# Patient Record
Sex: Male | Born: 1945 | Race: White | Hispanic: No | Marital: Married | Smoking: Never smoker
Health system: Southern US, Community
[De-identification: ages and names within clinical notes are randomized; demographics above are authoritative.]

## PROBLEM LIST (undated history)

## (undated) DIAGNOSIS — I10 Essential (primary) hypertension: Secondary | ICD-10-CM

## (undated) DIAGNOSIS — E782 Mixed hyperlipidemia: Secondary | ICD-10-CM

## (undated) DIAGNOSIS — H269 Unspecified cataract: Secondary | ICD-10-CM

## (undated) HISTORY — DX: Essential (primary) hypertension: I10

## (undated) HISTORY — PX: JOINT REPLACEMENT: SHX530

## (undated) HISTORY — DX: Unspecified cataract: H26.9

## (undated) HISTORY — DX: Mixed hyperlipidemia: E78.2

## (undated) HISTORY — PX: OTHER SURGICAL HISTORY: SHX169

## (undated) HISTORY — PX: EYE SURGERY: SHX253

---

## 2005-11-08 ENCOUNTER — Encounter: Payer: Self-pay | Admitting: Orthopedic Surgery

## 2005-11-09 ENCOUNTER — Inpatient Hospital Stay (HOSPITAL_COMMUNITY): Admission: EM | Admit: 2005-11-09 | Discharge: 2005-11-13 | Payer: Self-pay | Admitting: Emergency Medicine

## 2005-11-09 ENCOUNTER — Encounter (INDEPENDENT_AMBULATORY_CARE_PROVIDER_SITE_OTHER): Payer: Self-pay | Admitting: *Deleted

## 2005-11-09 ENCOUNTER — Ambulatory Visit: Payer: Self-pay | Admitting: Internal Medicine

## 2005-11-09 ENCOUNTER — Ambulatory Visit: Payer: Self-pay | Admitting: Cardiology

## 2006-12-26 ENCOUNTER — Ambulatory Visit: Payer: Self-pay | Admitting: Cardiology

## 2007-01-08 ENCOUNTER — Ambulatory Visit: Payer: Self-pay | Admitting: Cardiology

## 2007-01-08 ENCOUNTER — Encounter: Payer: Self-pay | Admitting: Cardiology

## 2007-01-08 ENCOUNTER — Ambulatory Visit: Payer: Self-pay

## 2007-01-08 LAB — CONVERTED CEMR LAB
Alkaline Phosphatase: 77 units/L (ref 39–117)
Basophils Absolute: 0.1 10*3/uL (ref 0.0–0.1)
Basophils Relative: 0.7 % (ref 0.0–1.0)
Direct LDL: 162 mg/dL
GFR calc non Af Amer: 73 mL/min
HDL: 40.2 mg/dL (ref >39.0)
Ketones, ur: NEGATIVE mg/dL
Leukocytes, UA: NEGATIVE
MCHC: 34.9 g/dL (ref 30.0–36.0)
MCV: 92.6 fL (ref 78.0–100.0)
Monocytes Absolute: 0.5 10*3/uL (ref 0.2–0.7)
Monocytes Relative: 7.1 % (ref 3.0–11.0)
Neutro Abs: 4.8 10*3/uL (ref 1.4–7.7)
Neutrophils Relative %: 65.7 % (ref 43.0–77.0)
Nitrite: NEGATIVE
RBC: 5.28 M/uL (ref 4.22–5.81)
Sodium: 143 meq/L (ref 135–145)
Total Bilirubin: 1 mg/dL (ref 0.3–1.2)
Total CHOL/HDL Ratio: 5.4
Total Protein: 6.8 g/dL (ref 6.0–8.3)
Urobilinogen, UA: 0.2 (ref 0.0–1.0)
VLDL: 22 mg/dL (ref 0–40)
WBC: 7.4 10*3/uL (ref 4.5–10.5)

## 2007-01-10 ENCOUNTER — Ambulatory Visit: Payer: Self-pay | Admitting: Cardiology

## 2007-02-17 ENCOUNTER — Ambulatory Visit: Payer: Self-pay | Admitting: Cardiology

## 2007-02-17 LAB — CONVERTED CEMR LAB
AST: 50 units/L — ABNORMAL HIGH (ref 0–37)
Alkaline Phosphatase: 67 units/L (ref 39–117)
Bilirubin, Direct: 0.2 mg/dL (ref 0.0–0.3)
Cholesterol: 138 mg/dL (ref 0–200)
Total Protein: 6.9 g/dL (ref 6.0–8.3)

## 2007-03-07 ENCOUNTER — Ambulatory Visit: Payer: Self-pay | Admitting: Cardiology

## 2007-09-02 ENCOUNTER — Ambulatory Visit: Payer: Self-pay | Admitting: Cardiology

## 2007-09-15 ENCOUNTER — Ambulatory Visit: Payer: Self-pay

## 2007-09-15 ENCOUNTER — Encounter: Payer: Self-pay | Admitting: Cardiology

## 2008-03-01 ENCOUNTER — Ambulatory Visit: Payer: Self-pay | Admitting: Cardiology

## 2008-03-01 LAB — CONVERTED CEMR LAB
AST: 39 units/L — ABNORMAL HIGH (ref 0–37)
Albumin: 4.1 g/dL (ref 3.5–5.2)
BUN: 27 mg/dL — ABNORMAL HIGH (ref 6–23)
CO2: 31 meq/L (ref 19–32)
Calcium: 9.3 mg/dL (ref 8.4–10.5)
Cholesterol: 141 mg/dL (ref 0–200)
Creatinine, Ser: 1.2 mg/dL (ref 0.4–1.5)
Glucose, Bld: 61 mg/dL — ABNORMAL LOW (ref 70–99)
HDL: 32 mg/dL — ABNORMAL LOW (ref >39.0)
Sodium: 142 meq/L (ref 135–145)
Total Bilirubin: 1.1 mg/dL (ref 0.3–1.2)
Total CHOL/HDL Ratio: 4.4
Total Protein: 6.8 g/dL (ref 6.0–8.3)
VLDL: 23 mg/dL (ref 0–40)

## 2009-04-01 ENCOUNTER — Ambulatory Visit: Payer: Self-pay | Admitting: Cardiology

## 2009-04-05 LAB — CONVERTED CEMR LAB
ALT: 38 units/L (ref 0–53)
AST: 30 units/L (ref 0–37)
Albumin: 4.4 g/dL (ref 3.5–5.2)
Bilirubin, Direct: 0.2 mg/dL (ref 0.0–0.3)
Calcium: 10 mg/dL (ref 8.4–10.5)
Glucose, Bld: 87 mg/dL (ref 70–99)
HDL: 40.7 mg/dL (ref >39.00)
Potassium: 4.1 meq/L (ref 3.5–5.1)
Sodium: 143 meq/L (ref 135–145)
Total Bilirubin: 1.1 mg/dL (ref 0.3–1.2)
Total CHOL/HDL Ratio: 4
Triglycerides: 87 mg/dL (ref 0.0–149.0)

## 2009-04-06 ENCOUNTER — Encounter: Payer: Self-pay | Admitting: Cardiology

## 2010-04-26 ENCOUNTER — Ambulatory Visit: Payer: Self-pay | Admitting: Cardiology

## 2010-05-03 ENCOUNTER — Ambulatory Visit: Payer: Self-pay | Admitting: Cardiology

## 2010-05-03 ENCOUNTER — Ambulatory Visit: Payer: Self-pay | Admitting: Cardiovascular Disease

## 2010-05-03 ENCOUNTER — Ambulatory Visit (HOSPITAL_COMMUNITY): Admission: RE | Admit: 2010-05-03 | Discharge: 2010-05-03 | Payer: Self-pay | Admitting: Cardiology

## 2010-05-03 ENCOUNTER — Encounter: Payer: Self-pay | Admitting: Cardiology

## 2010-05-03 ENCOUNTER — Ambulatory Visit: Payer: Self-pay

## 2010-05-09 ENCOUNTER — Telehealth: Payer: Self-pay | Admitting: Cardiology

## 2010-05-09 LAB — CONVERTED CEMR LAB
AST: 26 units/L (ref 0–37)
Calcium: 9.8 mg/dL (ref 8.4–10.5)
Cholesterol: 237 mg/dL — ABNORMAL HIGH (ref 0–200)
Creatinine, Ser: 1.1 mg/dL (ref 0.4–1.5)
Direct LDL: 174.7 mg/dL
GFR calc non Af Amer: 73.97 mL/min (ref >60)
Sodium: 141 meq/L (ref 135–145)
TSH: 1.4 microintl units/mL (ref 0.35–5.50)
Total Bilirubin: 0.9 mg/dL (ref 0.3–1.2)
Total CHOL/HDL Ratio: 6
Triglycerides: 157 mg/dL — ABNORMAL HIGH (ref 0.0–149.0)
VLDL: 31.4 mg/dL (ref 0.0–40.0)

## 2010-10-31 NOTE — Assessment & Plan Note (Signed)
Summary: 401.1  272.2  1 year rov pfh,rn   Visit Type:  1 yr f/u Primary Provider:  No PCP at this time  CC:  no cardiac complaints today.  History of Present Illness: Matthew Hull returns for evaluation and management his hypertension, mixed hyperlipidemia, and obesity.  His blood pressure is been running around 150 systolic when he checks it. This is usually after he's been doing something. He rarely takes at rest.  He denies any chest tightness or angina. He had orthopnea PND or edema.  He is no longer on simvastatin. He takes fish oil. Lipids from last are reviewed and looked remarkably good. He needs repeat values.  His weight is about the same. Activity levels about the same. He is compliant with his medications.  Current Medications (verified): 1)  Atenolol-Chlorthalidone 100-25 Mg Tabs (Atenolol-Chlorthalidone) .... Take 1/2 Tablet By Mouth Once A Day 2)  Fish Oil 500 Mg Caps (Omega-3 Fatty Acids) .[name].. 1 Cap Once Daily 3)  Aspirin 81 Mg Tbec (Aspirin) .... Take One Tablet By Mouth Daily 4)  Potassium Chloride Crys Cr 20 Meq Cr-Tabs (Potassium Chloride Crys Cr) .[name].. 1 Tab Once Daily  Allergies (verified): No Known Drug Allergies  Past History:  Past Medical History: Last updated: 28-Mar-2009 HYPERTENSION, UNSPECIFIED (ICD-401.9) HYPERLIPIDEMIA-MIXED (ICD-272.4)    Past Surgical History: Last updated: 03-28-2009  1.  I&D of skin and subcutaneous tissue, bone, and nonviable, necrotic.[name] 11/09/2005      tissue, right middle finger.  2.  Curettage osteomyelitic focus, right middle finger.  SURGEON:  Dionne Ano. Amanda Pea, MD.  Family History: Last updated: 03-28-09 Father: deceased at age 10..cancer throat/neck Mother: alive had pacemaker 3/08.Marland Kitchenas per new pt form 12/25/06 Siblings: 1 sister and 2 brothers.Marland Kitchenas per new pt form 12/25/06  Social History: Last updated: 03/28/09 Full Time Married  Tobacco Use - No.  Alcohol Use - yes Regular Exercise - no Drug Use -  no  Risk Factors: Exercise: no (2009-03-28)  Risk Factors: Smoking Status: never (28-Mar-2009)  Review of Systems       nother than history of present illness  Vital Signs:  Patient profile:   65 year old male Height:      71 inches Weight:      218 pounds BMI:     30.51 Pulse rate:   66 / minute Pulse rhythm:   regular BP sitting:   138 / 88  (left arm) Cuff size:   large  Vitals Entered By: Danielle Rankin, CMA (April 26, 2010 3:15 PM)  Physical Exam  General:  obese.   Head:  normocephalic and atraumatic Eyes:  PERRLA/EOM intact; conjunctiva and lids normal. Neck:  Neck supple, no JVD. No masses, thyromegaly or abnormal cervical nodes. Chest Matthew Hull:  no deformities or breast masses noted Lungs:  Clear bilaterally to auscultation and percussion. Heart:  Non-displaced PMI, chest non-tender; regular rate and rhythm, S1, S2 without murmurs, rubs or gallops. Carotid upstroke normal, no bruit. Normal abdominal aortic size, no bruits. Femorals normal pulses, no bruits. Pedals normal pulses. No edema, no varicosities. Abdomen:  Bowel sounds positive; abdomen soft and non-tender without masses, organomegaly, or hernias noted. No hepatosplenomegaly. Msk:  decreased ROM.   Pulses:  pulses normal in all 4 extremities Extremities:  No clubbing or cyanosis. Neurologic:  Alert and oriented x 3. Skin:  Intact without lesions or rashes. Psych:  Normal affect.   EKG  Procedure date:  04/26/2010  Findings:      normal sinus rhythm, normal EKG  Impression & Recommendations:  Problem # 1:  HYPERTENSION, UNSPECIFIED (ICD-401.9) I am concerned that his pressure has been elevated. Advised to check it in the morning hours he takes his atenolol and diuretic. His left ventricular hypertrophy reversed in the past. We'll recheck an echocardiogram to confirm that this has remained stable and that his pressure has indeed been good. His updated medication list for this problem includes:     Atenolol-chlorthalidone 100-25 Mg Tabs (Atenolol-chlorthalidone) .[name]... Take 1/2 tablet by mouth once a day    Aspirin 81 Mg Tbec (Aspirin) .[name]... Take one tablet by mouth daily  Problem # 2:  HYPERLIPIDEMIA-MIXED (ICD-272.4) We will check his fasting lipid panel and liver panel when he returns. The following medications were removed from the medication list:    Simvastatin 40 Mg Tabs (Simvastatin) .[name]... 1 tab at bedtime  Patient Instructions: 1)  Your physician recommends that you schedule a follow-up appointment in: 12 months with Dr Daleen Squibb 2)  Your physician has requested that you have an echocardiogram.  Echocardiography is a painless test that uses sound waves to create images of your heart. It provides your doctor with information about the size and shape of your heart and how well your heart's chambers and valves are working.  This procedure takes approximately one hour. There are no restrictions for this procedure. 3)  Your physician recommends that you return for lab work fasting same day as ech  lipid/liver/bmp/tsh  401.9 272.4  278.00

## 2010-10-31 NOTE — Miscellaneous (Signed)
Summary: echo  Clinical Lists Changes  Orders: Added new Referral order of Echocardiogram (Echo) - Signed

## 2010-10-31 NOTE — Progress Notes (Signed)
Summary: rtn call to get lab results  Phone Note Call from Patient Call back at Work Phone [phone] Call back at ext [phone]   Caller: Patient Reason for Call: Talk to Nurse, Talk to Doctor, Lab or Test Results Summary of Call: pt rtn call to get lab results Initial call taken by: Omer Jack,  May 09, 2010 4:19 PM  Follow-up for Phone Call        Mr. Penson is aware of lab results and recommendations as well as his ECHO results. Mylo Red RN     Appended Document: rtn call to get lab results  Reviewed Juanito Doom, MD

## 2011-02-13 NOTE — Assessment & Plan Note (Signed)
[facility]                            CARDIOLOGY OFFICE NOTE   NAME:Matthew Hull, Matthew Hull                          MRN:          [id number]  DATE:03/07/2007                            DOB:          06/21/1946    Mr. Matthew Hull returns today for further management of his hypertension and  hyperlipidemia.  His blood pressures have been under good control on  atenolol/hydrochlorothiazide.  He has lost an additional 4 pounds.  His  lipids on simvastatin 40 dropped from 216 to 138, triglycerides were 69,  ACL 38.8, LDL dropped from 162 to 85.  His LFTs were normal except for a  slight increase in AST.   He has no complaints.   His blood pressure is 132/87, his pulse is 64 and regular, his weight is  221.  The rest of the exam is unchanged.  HEART:  Reveals a regular rate and rhythm, no gallop.  LUNGS:  Clear.  EXTREMITIES:  Reveal no edema, pulses are present.   I am pleased with how Mr. Matthew Hull is doing.  I have asked him to continue  his therapeutic lifestyle changes.  I have asked him to continue  simvastatin, atenolol/hydrochlorothiazide.  Will have him return in  December at which time we will do a 2D echo to assess left ventricular  function, and see if we have any regression of left ventricular  hypertrophy.     Thomas C. Daleen Squibb, MD, [facility]  Electronically Signed    TCW/MedQ  DD: 03/07/2007  DT: 03/07/2007  Job #: [id number]

## 2011-02-13 NOTE — Assessment & Plan Note (Signed)
[facility]                            CARDIOLOGY OFFICE NOTE   NAME:Matthew Hull, Matthew Hull                          MRN:          [id number]  DATE:09/02/2007                            DOB:          05-11-1946    Matthew Hull comes in today for further management of his hypertension and  hyperlipidemia.   On simvastatin 40 mg nightly, his total cholesterol has dropped to 138,  triglycerides 69, HDL 39, LDL 85, down from 846.  His total  cholesterol/HDL ratio is 3.6.  LFTs were slightly elevated with an AST  of 50.   He continues on atenolol/chlorthalidone 100/25 1/2 tablet daily.  He is  on aspirin 81 mg a day.  He takes omega 3.   He feels remarkably good.  His blood pressure is 130/96 today.  He does not check it at home, but  his pressures have been good before in the office.  His pulse is 71 and  regular.  His weight is 224.  HEENT:  Unchanged.  Carotid upstrokes are equal bilaterally without bruits.  No JVD.  Thyroid is not enlarged.  Trachea is midline.  LUNGS:  Clear.  HEART:  A poorly appreciated PMI.  He has a normal S1 and S2.  ABDOMEN:  Protuberant.  EXTREMITIES:  No edema.  Pulses are intact.  NEUROLOGIC:  Intact.   EKG shows some decrease in voltage versus technique.   ASSESSMENT/PLAN:  Matthew Hull seems to be doing much better with his  cardiovascular risk factors.  His blood pressure is a little borderline  today but  generally is under good control.  His first 2D echocardiogram  showed significant left ventricular hypertrophy.  We will repeat this to  see if there has been any regression from April, 2008.   I will plan on seeing him back again in six months.     Thomas C. Daleen Squibb, MD, [facility]  Electronically Signed    TCW/MedQ  DD: 09/02/2007  DT: 09/03/2007  Job #: [id number]

## 2011-02-13 NOTE — Assessment & Plan Note (Signed)
[facility]                                 ON-CALL NOTE   NAME:[name], [name]                          MRN:          [id number]  DATE:03/01/2008                            DOB:          1946/07/22    I received a call from the [place] lab tonight that Matthew Hull had a  BMET checked earlier today, and his potassium was critically low at 2.7.  I called Matthew Hull multiple times and left messages, and he finally got  back to me just now at about 9:40 p.m. His local pharmacy is closed, and  so he tells me he is going to try to eat several bananas tonight.  However, I have gone ahead and called in a prescription to his local  [facility], that is [phone], for K-Dur 40 mEq 1 p.o. daily #33 with 6  refills. The patient has been instructed to take 3 tablets throughout  the course of tomorrow and then take 1 tablet daily. He will have repeat  BMET performed at [facility] in [place] on Thursday or Friday. [name] verbalized understanding and was grateful for the call back.      Nicolasa Ducking, ANP  Electronically Signed      Jesse Sans. Daleen Squibb, MD, [facility]  Electronically Signed   CB/MedQ  DD: 03/01/2008  DT: 03/01/2008  Job #: [id number]

## 2011-02-13 NOTE — Assessment & Plan Note (Signed)
[facility]                            CARDIOLOGY OFFICE NOTE   NAME:MILLERJef, Futch                          MRN:          [id number]  DATE:03/01/2008                            DOB:          June 04, 1946    Mr. Russman comes in today for further management of his hypertension and  hyperlipidemia.  His blood pressure has been under great control.  He  had resolution of his LVH on his last echocardiogram dated September 15, 2007.  He is due lipids, but his lipids were at goal last spring.   MEDICATIONS:  1. Multivitamin.  2. Aspirin 81 mg a day.  3. Omega 3.  4. Simvastatin 40 mg nightly.  5. Atenolol/chlorthalidone 100/25 one-half daily.   PHYSICAL EXAMINATION:  His blood pressure is 116/74.  His pulse is 78  and regular.  His weight is 221 down 3.  He has grown a seamen's beard  which looks very becoming.  He wears glasses.  HEENT:  Otherwise unchanged.  Carotids are full without bruits, no JVD.  Thyroid is not enlarged.  Trachea is midline.  LUNGS:  Clear.  HEART:  Reveals a nondisplaced PMI.  Normal S1-S2.  ABDOMINAL EXAM:  Soft, good bowel sounds.  No obvious midline bruits.  EXTREMITIES:  Reveal no cyanosis, clubbing, or edema.  Pulses are brisk.  NEURO:  Exam is intact.  SKIN:  Unremarkable.   Mr. Duque blood pressure is under excellent control.  He is due  lipids which we will obtain, as well as a CMP.  I will see him back in a  year.     Thomas C. Daleen Squibb, MD, [facility]  Electronically Signed    TCW/MedQ  DD: 03/01/2008  DT: 03/01/2008  Job #: [id number]

## 2011-02-16 NOTE — Discharge Summary (Signed)
NAMEMarland Kitchen  ELIC, VENCILL NO.:  [id number]   MEDICAL RECORD NO.:  [id number]          PATIENT TYPE:  INP   LOCATION:  5022                         FACILITY:  [facility]   PHYSICIAN:  Dionne Ano. Gramig, M.D.DATE OF BIRTH:  1946/04/21   DATE OF ADMISSION:  11/09/2005  DATE OF DISCHARGE:  11/13/2005                                 DISCHARGE SUMMARY   ADMITTING DIAGNOSES:  1.  Right middle finger osteomyelitis.  2.  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   DISCHARGE DIAGNOSES:  1.  Improved.  2.  Hypertension.   SURGEON:  Dionne Ano. Amanda Pea, M.D.   CONSULTS:  1.  Dr. Daleen Squibb, cardiology.  2.  Dr. Orvan Falconer, infectious disease.   PROCEDURE:  I&D right middle finger with curettage of the distal phalanx.   BRIEF HISTORY OF PRESENT ILLNESS:  Mr. Gilliand is a very pleasant gentleman  who is 65 years of age and presents for evaluation of his right middle  finger per the referral of Dr. Darrelyn Hillock.  Mr. Wymore has a history of an  infected process about the distal phalanx of the middle finger beginning in  January at which point in time he was seen and evaluated at [facility] in  [place].  Initial antibiotics were given as well as a lancing.  Cultures were indicative of beta hemolytic strep group G and staph aureus  sensitive to oxacillin.  He was subsequently placed on antibiotics and  observed.  However, he had continued worsening of the digit and was referred  to [facility] where Dr. Darrelyn Hillock evaluated him and then  referred him to hand/extremity surgeon Dionne Ano. Gramig.  He was noted to  have continued signs of soft tissue swelling, erythema, and radiographs  consistent with osteomyelitis.  For this reason, decision was made to  proceed to the operative arena for I&D and curettage and continued IV  antibiotics.  Preoperatively, the patient underwent standard preoperative  laboratory data and his EKG showed possible evidence of  atrial fibrillation.  For these purposes Dr. Daleen Squibb of cardiology was consulted and this appeared to  be artifact on the EKG as he was found to have no gross evidence of atrial  fibrillation.  Preoperative clearance was obtained and the decision was made  to proceed to the OR for operative intervention.   HOSPITAL COURSE:  The patient was admitted on November 09, 2005, and  underwent an I&D of the right middle finger as well as the subcutaneous  tissue, skin and bone, in addition to curettage of the right middle finger  distal phalanx.  Intraoperative cultures were obtained; please see operative  report for details.  Certainly, given his history of antibiotics for greater  than 1 month's time, skewed results were suspected.  The patient did have  MRI and bone scan performed preoperatively which was indicative of  osteomyelitis.  The patient tolerated the procedure very well.  He was  admitted to the orthopedic unit for standard orthopedic orders and pain  control.  In addition, he underwent wound care daily in the form of  hydrotherapy followed by wet-to-dry dressing changes.  He did very well  postoperatively, his pain was controlled.  He was initially started on  vancomycin.  However, as results of the cultures were inconclusive,  infectious disease, Dr. Orvan Falconer, saw and evaluated the patient and  recommended Rocephin as this appeared to be methicillin-sensitive  Staphylococcus aureus given previous cultures.  The patient underwent  placement of a PICC line with radiographic evidence of appropriate  placement.  He began Rocephin and it was recommended that he continue  Rocephin IV for 4 weeks via the PICC line.  His condition continued to  improve.  He had temperature spiking to 99.7.  Otherwise, he remained  afebrile.  His pain was controlled and his wound continued to improve  throughout the hospital stay.  On November 13, 2005, decision was made to  discharge him home as overall his  wound looked much better with less  erythema and no active infection was present.  His vital signs were stable,  he was afebrile.  It was noted that throughout his hospital stay his blood  pressures had increased with systolic being in the 150s and diastolic being  in the low 100s at times and it was recommended that he follow up with his  primary care physician in outpatient setting for a complete physical and  further treatment.   FINAL DIAGNOSES:  1.  Osteomyelitis right middle finger status post irrigation and debridement      soft tissue, skin, and bone with curettage of the bone, with      peripherally-inserted central line placement for intravenous      antibiotics.  2.  Hypertension.   PLAN:  Condition on discharge:  Improved.  Diet is regular.  Activities:  He  will keep his dressings clean, dry, and intact.  Arrangements will be made  for IV antibiotic administration through [facility].  He will return  to the office for whirlpools at 10 a.m. tomorrow morning.  He will be on IV  Rocephin for 4 weeks.  He was given Percocet for pain one to two p.o. q.4-  6h. p.r.n. and Peri-Colace over-the-counter to prevent constipation.      Karie Chimera, P.A.-C.    ______________________________  Dionne Ano. Amanda Pea, M.D.    BB/MEDQ  D:  01/10/2006  T:  01/10/2006  Job:  [id number]

## 2011-02-16 NOTE — Op Note (Signed)
NAMEMarland Kitchen  Matthew Hull, Matthew Hull NO.:  [id number]   MEDICAL RECORD NO.:  [id number]          PATIENT TYPE:  INP   LOCATION:  5022                         FACILITY:  [facility]   PHYSICIAN:  Dionne Ano. 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   POSTOPERATIVE DIAGNOSIS:  Chronic right middle finger infection with  evidence of osteomyelitis.   PROCEDURE:  1.  I&D of skin and subcutaneous tissue, bone, and nonviable, necrotic      tissue, right middle finger.  2.  Curettage osteomyelitic focus, right middle finger.   SURGEON:  Dionne Ano. Amanda Pea, MD.   ASSISTANT:  None.   COMPLICATIONS:  None.   ANESTHESIA:  LMA general anesthetic.   CULTURES:  Tissue culture x1, fluid culture x2, and bone culture x1 were  taken.  Pathology was sent of the bone, which was curettaged.   ESTIMATED BLOOD LOSS:  Minimal.   DRAINS:  One.   INDICATIONS FOR PROCEDURE:  Matthew Hull is a 65 year old male, who presents  with the above-mentioned diagnosis.  I have counseled him in regards to the  risks and benefits of surgery including the risk of infection, bleeding,  anesthesia, damage to normal structures, and failure of surgery to  accomplish its intended goals, and __________ and restoring function.  With  this in mind, he has asked that I proceed.  All questions have been  encouraged and answered preoperatively.   OPERATIVE FINDINGS:  This patient had a large focus of walled-off abscess  about the deep pulp tissues of the finger.  Dissecting down, he had evidence  of encroachment of the periosteal tissues indicative of osteomyelitis.  This  was sent for culture and debrided.   OPERATION IN DETAIL:  The patient was __________ and anesthesia and taken to  the operative suite and underwent a  smooth induction of general LMA  anesthesia.  He was laid supine and appropriately padded, prepped and draped  in the usual sterile fashion about the right upper extremity.  Once this was  done, operation commenced with an incision about the volar pulp region.  His  nail was debrided as necessary.  I made an incision midline, dissected down  and found an obvious area of fluid, which was cultured x2.  I then removed  devitalized and necrotic tissue, which looked highly infectious.  This was  sent for tissue culture.  There was a wide area of infectious process  somewhat walled off and adjacent to the periosteal regions of the distal  phalanx.  This was I&D'd aggressively, and all pre-necrotic and necrotic  tissue was removed.  Following this, it was very apparent that the  periosteum had been encroached upon and was infected.  I thus debrided the  bone and sent bony cultures as well as bone specimen for pathology.  This is  in an effort to evaluate the osteomyelitis, which was notable on the  preop  studies including MRI and bone scan.  Cultures and pathology of the bone  were obtained.  Following this, the patient was irrigated with greater than  three liters of fluid, a drain was placed, the tourniquet was deflated  during the irrigation process, and the finger was then packed with a drain  and a sterile dressing.  He tolerated this well, and there were no  complicating features.  All sponge, needle, and instrument counts were  reported as correct.  Once the bandage was placed, he was taken to the  recovery room where he was noted to be in stable condition.  He will be  admitted for IV antibiotics, I will place a PICC line and await cultures,  although the cultures will likely be skewed given the fact that he has  previously been on antibiotics and has been trying to fight this infection  for well over a month with little success.  It is our plan to try to salvage  the finger; however, his  prognosis is guarded.  This may ultimately come to  an amputation, and the patient understands this.  I have discussed with the  patient these issues, I have discussed this with his family, and we will  proceed accordingly with attempted salvage of the finger.           ______________________________  Dionne Ano. Everlene Other, M.D.     Nash Mantis  D:  11/09/2005  T:  11/10/2005  Job:  [id number]

## 2011-02-16 NOTE — Assessment & Plan Note (Signed)
[facility]                            CARDIOLOGY OFFICE NOTE   NAME:MILLERZackerie, Sara                          MRN:          [id number]  DATE:01/08/2007                            DOB:          [birth year]    Mr. Hassell returns today for followup of his hypertension and laboratory  evaluation, as well as 2D echo.   His blood pressures have come down nicely and are averaging about 135  over about 80-85.  He has checked several measurements for [place].  His  heart rate came down, as well, from 116 to 66.  He is on atenolol/HCTZ  50/12.5 daily.   His 2D echocardiogram showed moderate left ventricular hypertrophy with  normal left ventricular systolic function.  There was no LV dilatation.  His valves were normal.  Right-sided structures were normal.   LABORATORY DATA:  Showed a hemoglobin of 17.1, otherwise normal.  Potassium of 3.8, BUN of 26, creatinine of 1.1, all consistent with  dehydration.  His LFT were normal, except for slight increase in his ALT  to 42, with a normal AST, total cholesterol was 216, triglycerides were  112 on fish oil.  HDL 40.2, LDL 161.  Hemoglobin A1c 5.4%.  His TSH was  normal.  Urinalysis was negative with no protein.   I have discussed these results with Mr. Arca at length.  We spent  about 20 minutes just talking about the importance of maintaining his  blood pressure at 130/80 and hoping for regression of his LVH.  In  addition, we have talked about statin for lowering his LDL certainly  below 100.   After a long discussion, we will continue the atenolol/HCTZ.  I have  placed him on simvastatin 40 mg q.h.s.  We will have him return in six  weeks for blood work and then see me in eight weeks.     Thomas C. Daleen Squibb, MD, [facility]  Electronically Signed    TCW/MedQ  DD: 01/10/2007  DT: 01/10/2007  Job #: [id number]

## 2011-02-16 NOTE — Consult Note (Signed)
NAMEMarland Kitchen  ROLDAN, LAFOREST NO.:  [id number]   MEDICAL RECORD NO.:  [id number]          PATIENT TYPE:  INP   LOCATION:  5022                         FACILITY:  [facility]   PHYSICIAN:  Dionne Ano. Gramig, M.D.DATE OF BIRTH:  11-23-1945   DATE OF CONSULTATION:  11/09/2005  DATE OF DISCHARGE:                                   CONSULTATION   CARDIOLOGY CONSULT   CONSULTING PHYSICIAN:  Dr. Daleen Squibb.   SUMMARY OF HISTORY:  Mr. Keyworth is a 65 year old white male, who was  admitted earlier today by Orthopaedics secondary to right finger  osteomyelitis.  Mr. Saavedra states that since October 09, 2005 he has had  problems with his finger.  He states that initially he felt like there was a  fever in the tip of the finger and it slowly began swelling, and then it  felt like the sensations were traveling up his arm.  He went to seek  evaluation at [facility] on January 10 and was prescribed antibiotics.  Since that time, he has been multiple courses of antibiotics and was  referred for consultation with Orthopaedics.  A scan yesterday was notable  for osteomyelitis thus his admission.  We were asked to consult because a  computer interpretation on an EKG revealed atrial fibrillation.  Mr. Creveling  denies any cardiac history.   PAST MEDICAL HISTORY:  No known drug allergies.  Current medications include  penicillin unknown dosage and frequency for the preceding 8 or so days.  He  has a history of hypertension that he was treated in [place] for.  However, he has not had any treatment for hypertension in 20 years.  He does  not check his blood pressure at home and he does not routinely see a primary  care physician.  He denies any prior cardiac testing.  He denies any history  of diabetes, myocardial infarction, CVA, COPD, bleeding, dyscrasias or  thyroid disorder.  He does not know his cholesterol.  He has had multiple  surgeries in the past to both his hands secondary to an accident that  occurred at work status post TNA.   SOCIAL HISTORY:  He resides in [place] with his wife.  He has 5 sons.  He is a Government social research officer.  He has not smoked in over 10 years.  Prior  to that, he had a rare cigar.  He has not had any alcohol in over a month  secondary to his antibiotics.  Prior to that, he would have an occasional  glass of wine.  He denies any drugs, herbal medications, specific diet or  exercise.  His mother is alive at the age of 64 with a history of CVA and  heart problems.  His father died at the age of 63 with esophageal cancer.  He has 2 brothers and 1 sister alive and well.   REVIEW OF SYSTEMS:  Notable for a 5-pound weight fluctuation that the  patient did not feel unusual, glasses, good dentition, a rare palpitation  that might occur once every other month that only lasts a second or  2.  He  denies any chest discomfort, shortness of breath, dyspnea on exertion,  orthopnea, PND, edema, syncope, claudication, coughing, wheezing, he does  snore, but his wife denies sleep apnea.  He also thinks he might have a  fungal infection on his left great toe.  All remainder of points of the  review of systems are negative.   PHYSICAL EXAMINATION:  VITAL SIGNS:  Temperature is 97.9, blood pressure is  132/86, pulse is 68, respirations are 16, actual weight is unknown,  estimated weight is 215.  GENERAL:  Well-nourished, well-developed pleasant white male in no apparent  distress.  Wife is present.  HEENT:  Unremarkable.  NECK:  Supple without thyromegaly, adenopathy, JVD or carotid bruits.  HEART:  PMI is not displaced.  Regular rate and rhythm.  Normal S1 and S2.  I did not appreciate any murmurs, rubs, clicks or gallops.  All pulses are  symmetrical and intact.  LUNGS:  Symmetrical, excursion clear to auscultation.  SKIN AND INTEGUMENT:  Intact without rashes or lesions.  ABDOMEN:  Obese, bowel sounds present without organomegaly, masses or  tenderness.   EXTREMITIES:  Negative cyanosis, clubbing or edema.  MUSCULOSKELETAL:  Essentially unremarkable although a finger on his right  hand is bandaged and he has some joint deformity in his right hand secondary  to multiple surgeries.  NEUROLOGICAL:  Grossly intact.   Chest x-ray showed mild cardiomegaly, COPD and no active disease.  EKG:  The  computer interpreted as atrial fibrillation.  However, specific reading of  the EKG shows normal sinus rhythm with a ventricular rate of 87.  Normal P,  R, QRS, and QTC intervals.  There are no acute findings of ischemia or LVH.  There is some baseline __________ .  There are no old EKGs for a comparison.  H&H is 15.9 and 46.3, normal indices, platelets 279, WBC 6.4, sodium 140,  potassium 3.8, BUN 18, creatinine 1.1, glucose 90, normal LFTs.   IMPRESSION:  Osteomyelitis, as previously described.  There was not any  documentation of atrial fibrillation on his EKG or a specific history of  dysrhythmia.  Possible history of hypertension.   DISPOSITION:  Dr. Daleen Squibb reviewed the patient's history, spoke with and  examined the patient and agreed with the above.  After reviewing the EKG, he  agrees the EKG has some artifact, but there was no evidence of atrial  fibrillation.  From a cardiovascular standpoint, he is cleared for surgery.  We would suggest to have him follow up with his primary care physician for  cardiac risk factor modifications such as diet, exercise, cholesterol checks  and possible treatment for hypertension.  I have suggested that once he is  through these procedures, that he check some home blood pressures and to  follow up with his primary care physician.      Joellyn Rued, P.A. [facility]    ______________________________  Dionne Ano. Amanda Pea, M.D.    EW/MEDQ  D:  11/09/2005  T:  11/10/2005  Job:  [id number]   cc:   [facility]  [address]  [place], [place] [address]

## 2011-02-16 NOTE — H&P (Signed)
NAMEMarland Hull  [name], [name] NO.:  [id number]   MEDICAL RECORD NO.:  [id number]          PATIENT TYPE:  INP   LOCATION:  5022                         FACILITY:  [facility]   PHYSICIAN:  Dionne Ano. Gramig III, M.D.DATE OF BIRTH:  1946-02-02   DATE OF ADMISSION:  11/09/2005  DATE OF DISCHARGE:                                HISTORY & PHYSICAL   Matthew Hull presents via the emergency room for evaluation of his right  middle finger.  This patient is very pleasant 65 year old male who was seen  by [facility] in [place] well over a month ago.  At that time, he was  felt to have an abscess.  He had a area that was lanced.  He was placed on  antibiotics and subsequently grew out beta-Hemolytic strep group G and  Staphylococcus aureus sensitive to oxacillin.  He was placed on penicillin  and observed.  He subsequently did not improve and actually had some  worsening and failure to progress in terms of return to quiescence.  He had  a bone scan performed, under the direction of [facility] in [place], and  subsequently this was noted to be highly reactive.  He then was referred to  orthopedics my partner, Dr. Worthy Hull, who saw him and subsequently  referred him for further care.  The patient notes that he has some mild  tenderness about the middle finger.  Of note, is he had a prior index finger  pollicization in the past due to injury to his thumb.  The patient notes  that he has continued taking the antibiotics daily but has not felt any  significant change in his situation.  He denies history of immune system  difficulties, unusual exposures, gout, pseudogout, inflammatory arthritis,  or other problems.   PAST MEDICAL HISTORY:  1.  History of hypertension.  2.  No history of heart disease, lung disease, kidney disease.   PAST SURGICAL HISTORY:  1.  Hand surgery in the past.  2.  Tonsillectomy and adenoid removal.   MEDICATIONS:  Penicillin taken over the last 10 days  and prior to this.   DRUG ALLERGIES:  None.   SOCIAL HISTORY:  The patient lives with his wife.  He is a Oceanographer.  The patient does not smoke.  He rarely consumes a glass of wine.  He does not use any type of herbal medicines or IV DA substances.   PHYSICAL EXAMINATION:  GENERAL:  He is alert and oriented.  VITAL SIGNS:  Stable.  MUSCULOSKELETAL:  The patient has a prior index finger pollicization where  it has been moved to the location of the thumb to allow for opposition.  This leaves the middle finger in its normal position.  The middle finger is  the area in question.  The patient has loss of significant portion of his  nail.  He has a small area which is raised and has an eschar over it.  This  does leak fluid when expressed.  The patient has no ascending cellulitis,  lymphangitis in the MCP or wrist region.  He has prior scars from his hand  surgery in the past.  He did not have any signs of dystrophic reaction.  The  distal tip of his finger, from the tip to the mid portion of the middle  phalanx, has a purplish hue to it.  The patient has no Kanavel signs.  There  is no tenderness in the thenar space, hypothenar space, or mid palmar space.  He is sensate.   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  I have reviewed this at length and his findings.   MRI scan is reviewed which is read out as degenerative arthritis as well as  notable osteomyelitis about the distal phalanx given the reactivity of the  bone etcetera.  There are no osteolytic lesions at present time in this  area.   I should note his sed rate is within normal limits.  His C-reactive protein  is 3.9.  His WBC count is normal.   IMPRESSION:  A four to six-week history of infectious process about the  right middle finger, status post incision and drainage at [facility] in  [place] in the past with evidence of osteomyelitis at present time and   failure to progress to healing.   PLAN:  I have discussed with the patient and given all of his findings  including the bone scan, MRI, plain film, and objective exam, as well as the  discharge from the area, it does not appear that this is simply a simple  abscess.  Unfortunately, it appears that the patient has a deep infection  within the bone consistent with osteomyelitis.  I have discussed with him  that this can be a very difficult infection to eradicate.  It would  certainly be easier if this was a typical felon or simple cellulitis,  however, the diagnostic studies and objective examination as well as time  frame duration of symptoms do point towards osteomyelitis as the working  diagnosis.  Due to this, I would recommend a formal I&D, bone sample,  culture and following this IV antibiotics until cultures are final and  possible IV antibiotics for four to six weeks followed by p.o.  I would  direct this towards the staphylococcus organism and have him discuss with  the patient that his cultures that we take may be skewed due to the fact  that he has been on antibiotics previously.  I have discussed with him there  are other options including amputation, however, I would certainly try to  salvage this as he does not have any osteolytic lesions from the active bone  infection at the present time.  We have gone over these issues in detail in  addition to all his questions, etcetera.  At the present time, he desires to  proceed with I&D, etcetera.  I am going to have him seen by infectious  disease as well as have a PICC line placed and perform the I&D today.  I  have discussed with him this is a situation that there is no definite time  frame or absolutes in terms of recovery.  It is our hope to eradicate the  infection, however, there is certainly no guarantee.  He understands this  and desires to proceed.          ______________________________  Dionne Ano. Everlene Other,  M.D.     Nash Mantis  D:  11/09/2005  T:  11/10/2005  Job:  [id number]

## 2011-02-16 NOTE — Assessment & Plan Note (Signed)
[facility]                            CARDIOLOGY OFFICE NOTE   NAME:Prieto, IRVINE                          MRN:          [id number]  DATE:12/26/2006                            DOB:          04-11-1946    Mr. 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   I saw him for some preoperative clearance for some surgery about a year  ago. I noticed his blood pressure was borderline high then.   He has been going to the dentist and noticing his blood pressures  getting higher and higher.   He has no symptoms of peripheral edema, orthopnea, PND or dyspnea on  exertion. He has had no angina. No history of syncope, tachy  palpitations.   He does not practice dietary discretion and he does eat a lot of salt.  He is overweight by about 20 to 25 pounds at a minimum.   PAST MEDICAL HISTORY:  1. He has no previous cardiac history.  2. He has no history of kidney disease.  3. He has had no history of stroke.   CURRENT MEDICATIONS:  1. Aspirin 81 mg a day several days a week.  2. Omega-3.  3. Multivitamin.  4. He does not take any appetite stimulants, decongestants. He does      drink three cups of caffeinated beverages a day.   ALLERGIES:  No known drug allergies.   PAST SURGICAL HISTORY:  He crushed his hands in a roller at age 48 in  [place]. A surgeon there took his index finger and made it into an  opposable pseudo thumb. He has a remarkable grip. He had a skin graft  taken from his abdominal free wall.   FAMILY HISTORY:  Is negative for premature coronary disease.   SOCIAL HISTORY:  He is married. He is quite active.   REVIEW OF SYSTEMS:  Other than the HPI is totally negative.   PHYSICAL EXAMINATION:  His blood pressure today is 161/111.  His pulse  is 116. EKG shows sinus tach, otherwise normal.  HEENT: Normocephalic, atraumatic. PERRLA.  Extra-ocular movements intact.  Sclerae are clear. Facial symmetry is normal.  NECK: Supple.  Carotids are full. There are no bruits. There is no JVD.  Thyroid is not palpable.  LUNGS:  Are clear.  HEART: Reveals a rapid rate and rhythm. There is no gallop, rub, or  murmur.  ABDOMEN: Protuberant with good bowel sounds.  There is no hepatomegaly.  His abdomen is quite distended and a little tense.  EXTREMITIES: Reveals no edema. Pulses are intact.   ASSESSMENT:  1. Probable essential hypertension.  2. Obesity.  3. Sedentary lifestyle.  4. Unknown lipid status.  5. Dietary indiscretion.   PLAN:  1. Begin atenolol/hydrochlorothiazide 50/12.5 q a.m. Hopefully, this      will decrease his heart rate and also bring down his pressure      substantially. He may need an additional agent. I explained this to  him.  2. Sodium restriction and caloric restriction.  3. Increase activity.  4. 2D echocardiogram.  5. Baseline blood work including a CMP, CBC, TSH and UA, hemoglobin      A1c and lipids.   I will see him back in two weeks. He will check blood pressures for me.  We will review all of his data at that time.     Thomas C. Daleen Squibb, MD, [facility]  Electronically Signed    TCW/MedQ  DD: 12/26/2006  DT: 12/26/2006  Job #: [id number]

## 2011-04-11 ENCOUNTER — Encounter: Payer: Self-pay | Admitting: Cardiology

## 2011-05-17 ENCOUNTER — Encounter: Payer: Self-pay | Admitting: Cardiology

## 2011-05-17 ENCOUNTER — Ambulatory Visit (INDEPENDENT_AMBULATORY_CARE_PROVIDER_SITE_OTHER): Payer: BC Managed Care – PPO | Admitting: Cardiology

## 2011-05-17 VITALS — BP 138/90 | HR 75 | Ht 71.0 in | Wt 230.0 lb

## 2011-05-17 DIAGNOSIS — E785 Hyperlipidemia, unspecified: Secondary | ICD-10-CM

## 2011-05-17 DIAGNOSIS — I10 Essential (primary) hypertension: Secondary | ICD-10-CM

## 2011-05-17 NOTE — Progress Notes (Signed)
HPI Matthew Hull  returns for evaluation and  management of his history of hypertension and hyperlipidemia. He has gained about 15-20 pounds. He does [place] every day about 2 miles. Denies any chest pain or angina. He is not checking his blood pressure on a regular basis. He is due for blood work.  EKG today shows normal sinus rhythm with occasional PVCs. He denies palpitations. Past Medical History  Diagnosis Date  . HTN (hypertension)     unspecified  . Mixed hyperlipidemia     Past Surgical History  Procedure Date  . I&d of skin and subcutaneous tissue, bone and nonviable, necrotic tissure, right middle finger   . Curettage osteomyelitis focus; right middle finger     Family History  Problem Relation Age of Onset  . Cancer Father     throat/neck    History   Social History  . Marital Status: Married    Spouse Name: N/A    Number of Children: N/A  . Years of Education: N/A   Occupational History  . Not on file.   Social History Main Topics  . Smoking status: Never Smoker   . Smokeless tobacco: Not on file  . Alcohol Use: Yes  . Drug Use: No  . Sexually Active: Not on file   Other Topics Concern  . Not on file   Social History Narrative  . No narrative on file    Not on File  Current Outpatient Prescriptions  Medication Sig Dispense Refill  . aspirin 81 MG tablet Take 81 mg by mouth daily.        [name] atenolol-chlorthalidone (TENORETIC) 100-25 MG per tablet Take 0.5 tablets by mouth daily.        . Omega-3 Fatty Acids (SUPER TWIN EPA/DHA) 1250 MG CAPS Take 1 capsule by mouth daily.        . potassium chloride SA (K-DUR,KLOR-CON) 20 MEQ tablet Take 20 mEq by mouth daily.          ROS Negative other than HPI.   PE General Appearance: well developed, well nourished in no acute distress, overweight HEENT: symmetrical face, PERRLA, good dentition  Neck: no JVD, thyromegaly, or adenopathy, trachea midline Chest: symmetric without deformity Cardiac: PMI  non-displaced, RRR, normal S1, S2, no gallop or murmur Lung: clear to ausculation and percussion Vascular: all pulses full without bruits  Abdominal: nondistended, nontender, good bowel sounds, no HSM, no bruits Extremities: no cyanosis, clubbing or edema, no sign of DVT, no varicosities  Skin: normal color, no rashes Neuro: alert and oriented x 3, non-focal Pysch: normal affect Filed Vitals:   05/17/11 1628  BP: 138/90  Pulse: 75  Height: 5\' 11"  (1.803 m)  Weight: 230 lb (104.327 kg)    EKG  Labs and Studies Reviewed.   Lab Results  Component Value Date   WBC 7.4 01/08/2007   HGB 17.1* 01/08/2007   HCT 48.9 01/08/2007   MCV 92.6 01/08/2007   PLT 248 01/08/2007      Chemistry      Component Value Date/Time   NA 141 05/03/2010 1034   K 4.9 05/03/2010 1034   CL 98 05/03/2010 1034   CO2 32 05/03/2010 1034   BUN 24* 05/03/2010 1034   CREATININE 1.1 05/03/2010 1034      Component Value Date/Time   CALCIUM 9.8 05/03/2010 1034   ALKPHOS 71 05/03/2010 1034   AST 26 05/03/2010 1034   ALT 36 05/03/2010 1034   BILITOT 0.9 05/03/2010 1034  Lab Results  Component Value Date   CHOL 237* 05/03/2010   CHOL 149 04/01/2009   CHOL 141 03/01/2008   Lab Results  Component Value Date   HDL 38.50* 05/03/2010   HDL 16.10 04/01/2009   HDL 32.0* 03/01/2008   Lab Results  Component Value Date   LDLCALC 91 04/01/2009   LDLCALC 86 03/01/2008   LDLCALC 85 02/17/2007   Lab Results  Component Value Date   TRIG 157.0* 05/03/2010   TRIG 87.0 04/01/2009   TRIG 117 03/01/2008   Lab Results  Component Value Date   CHOLHDL 6 05/03/2010   CHOLHDL 4 04/01/2009   CHOLHDL 4.4 CALC 03/01/2008   Lab Results  Component Value Date   HGBA1C 5.4 01/08/2007   Lab Results  Component Value Date   ALT 36 05/03/2010   AST 26 05/03/2010   ALKPHOS 71 05/03/2010   BILITOT 0.9 05/03/2010   Lab Results  Component Value Date   TSH 1.40 05/03/2010

## 2011-05-17 NOTE — Patient Instructions (Signed)
Your physician recommends that you return for lab work on: 05/30/11 for fasting lab work.   We will call you with your lab results.  Your physician recommends that you schedule a follow-up appointment in: 1 year with Dr. Daleen Squibb

## 2011-05-17 NOTE — Assessment & Plan Note (Signed)
I rechecked his blood pressure was 125/90. A basket to follow a low sodium diet, count sodium milligrams, and lose about 15 pounds. Blood pressure goal at 130/80 .  We'll arrange for him to have blood work including a comprehensive metabolic panel, TSH, and lipid panel.

## 2011-05-17 NOTE — Assessment & Plan Note (Signed)
Labs to be checked. Looking at his past numbers, he may need a statin.

## 2011-05-24 ENCOUNTER — Other Ambulatory Visit: Payer: Self-pay | Admitting: Cardiology

## 2011-05-30 ENCOUNTER — Ambulatory Visit (INDEPENDENT_AMBULATORY_CARE_PROVIDER_SITE_OTHER): Payer: BC Managed Care – PPO | Admitting: *Deleted

## 2011-05-30 DIAGNOSIS — I10 Essential (primary) hypertension: Secondary | ICD-10-CM

## 2011-05-30 DIAGNOSIS — E785 Hyperlipidemia, unspecified: Secondary | ICD-10-CM

## 2011-05-30 LAB — LIPID PANEL
HDL: 42.6 mg/dL (ref >39.00)
Triglycerides: 87 mg/dL (ref 0.0–149.0)

## 2011-05-30 LAB — BASIC METABOLIC PANEL
BUN: 29 mg/dL — ABNORMAL HIGH (ref 6–23)
CO2: 27 mEq/L (ref 19–32)
Calcium: 9.3 mg/dL (ref 8.4–10.5)
Creatinine, Ser: 1.1 mg/dL (ref 0.4–1.5)
GFR: 73.72 mL/min (ref >60.00)
Potassium: 3.8 mEq/L (ref 3.5–5.1)

## 2011-05-30 LAB — TSH: TSH: 1.52 u[IU]/mL (ref 0.35–5.50)

## 2011-05-30 LAB — HEPATIC FUNCTION PANEL
ALT: 38 U/L (ref 0–53)
Total Bilirubin: 0.6 mg/dL (ref 0.3–1.2)
Total Protein: 7.1 g/dL (ref 6.0–8.3)

## 2011-06-03 ENCOUNTER — Other Ambulatory Visit: Payer: Self-pay | Admitting: Cardiology

## 2011-06-08 ENCOUNTER — Telehealth: Payer: Self-pay | Admitting: *Deleted

## 2011-06-08 NOTE — Telephone Encounter (Signed)
LMTCB Debbie Brendin Situ RN  

## 2011-06-08 NOTE — Telephone Encounter (Signed)
Message copied by Theda Belfast on Fri Jun 08, 2011 10:57 AM ------      Message from: Valera Castle C      Created: Fri Jun 01, 2011  5:32 PM       Add 20 mg of atorvastatin q.h.s. Check labs in 6 weeks. Goal LDL less than 100.

## 2011-06-12 NOTE — Progress Notes (Signed)
Addended by: Judithe Modest D on: 06/12/2011 04:24 PM   Modules accepted: Orders

## 2011-06-14 ENCOUNTER — Telehealth: Payer: Self-pay | Admitting: *Deleted

## 2011-06-14 MED ORDER — ATORVASTATIN CALCIUM 20 MG PO TABS: 20.0000 mg | ORAL_TABLET | Freq: Every day | ORAL | Status: DC

## 2011-06-14 NOTE — Telephone Encounter (Signed)
Pt is aware of lab results.  We discussed the various statins as he had been reluctant in the past to start a statin. Pt wife is a Engineer, civil (consulting) and had felt he should not b/c of all the potential muscular side effect. Pt has decided to start atorvastatin 20mg  qhs and will call back if he is unable to tolerate the medication.  If he tolerates will schedule fasting lipid/liver in 6 weeks. Mylo Red RN

## 2011-06-14 NOTE — Telephone Encounter (Signed)
Message copied by Theda Belfast on Thu Jun 14, 2011  1:46 PM ------      Message from: Valera Castle C      Created: Fri Jun 01, 2011  5:32 PM       Add 20 mg of atorvastatin q.h.s. Check labs in 6 weeks. Goal LDL less than 100.

## 2011-06-14 NOTE — Telephone Encounter (Signed)
lmtcb Debbie Xana Bradt RN  

## 2011-06-14 NOTE — Telephone Encounter (Signed)
See previous note

## 2011-06-14 NOTE — Telephone Encounter (Signed)
Pt aware of results see office note Mylo Red RN

## 2011-07-11 ENCOUNTER — Telehealth: Payer: Self-pay | Admitting: Cardiology

## 2011-07-11 DIAGNOSIS — Z79899 Other long term (current) drug therapy: Secondary | ICD-10-CM

## 2011-07-11 DIAGNOSIS — E785 Hyperlipidemia, unspecified: Secondary | ICD-10-CM

## 2011-07-11 NOTE — Telephone Encounter (Signed)
SPOKE WITH  PT WILL SCHEDULE  LAB  LIPID AND  LIVER  FOR 07-26-11 SINCE STARTING ATORVASTATIN  20 MG PT AWARE./CY

## 2011-07-11 NOTE — Telephone Encounter (Signed)
Pt call wanting to schedule blood work. Please return pt call to discuss further.

## 2011-07-11 NOTE — Telephone Encounter (Signed)
Pt returning your call

## 2011-07-26 ENCOUNTER — Ambulatory Visit (INDEPENDENT_AMBULATORY_CARE_PROVIDER_SITE_OTHER): Payer: BC Managed Care – PPO | Admitting: *Deleted

## 2011-07-26 DIAGNOSIS — E785 Hyperlipidemia, unspecified: Secondary | ICD-10-CM

## 2011-07-26 DIAGNOSIS — I10 Essential (primary) hypertension: Secondary | ICD-10-CM

## 2011-07-26 LAB — HEPATIC FUNCTION PANEL
ALT: 48 U/L (ref 0–53)
AST: 30 U/L (ref 0–37)
Albumin: 4.5 g/dL (ref 3.5–5.2)
Alkaline Phosphatase: 77 U/L (ref 39–117)

## 2011-07-26 LAB — LIPID PANEL
LDL Cholesterol: 75 mg/dL (ref 0–99)
VLDL: 17 mg/dL (ref 0.0–40.0)

## 2011-08-02 ENCOUNTER — Encounter: Payer: Self-pay | Admitting: *Deleted

## 2011-08-15 ENCOUNTER — Telehealth: Payer: Self-pay | Admitting: Cardiology

## 2011-08-15 NOTE — Telephone Encounter (Signed)
Reviewed cholesterol results with pt. He did receive a copy at home. He will continue Atorvastatin 20mg  qhs. With follow-up lipid/liver in 1 year. Mylo Red RN

## 2011-08-15 NOTE — Telephone Encounter (Signed)
LMOVM  Debbie Shawnee Gambone RN  

## 2011-08-15 NOTE — Telephone Encounter (Signed)
Extension 125/regarding lab results.  Please call him back.

## 2011-08-15 NOTE — Telephone Encounter (Signed)
Pt rtn call 

## 2011-11-29 ENCOUNTER — Other Ambulatory Visit: Payer: Self-pay | Admitting: Cardiology

## 2012-05-30 ENCOUNTER — Encounter: Payer: Self-pay | Admitting: *Deleted

## 2012-06-04 ENCOUNTER — Ambulatory Visit (INDEPENDENT_AMBULATORY_CARE_PROVIDER_SITE_OTHER): Payer: BC Managed Care – PPO | Admitting: Cardiology

## 2012-06-04 ENCOUNTER — Encounter: Payer: Self-pay | Admitting: Cardiology

## 2012-06-04 VITALS — BP 154/97 | HR 68 | Ht 71.5 in | Wt 228.1 lb

## 2012-06-04 DIAGNOSIS — I10 Essential (primary) hypertension: Secondary | ICD-10-CM

## 2012-06-04 DIAGNOSIS — E785 Hyperlipidemia, unspecified: Secondary | ICD-10-CM

## 2012-06-04 LAB — BASIC METABOLIC PANEL
CO2: 26 mEq/L (ref 19–32)
Calcium: 9.5 mg/dL (ref 8.4–10.5)
Chloride: 102 mEq/L (ref 96–112)
GFR: 69 mL/min (ref >60.00)
Glucose, Bld: 86 mg/dL (ref 70–99)
Potassium: 3.5 mEq/L (ref 3.5–5.1)
Sodium: 136 mEq/L (ref 135–145)

## 2012-06-04 LAB — LIPID PANEL
Cholesterol: 204 mg/dL — ABNORMAL HIGH (ref 0–200)
HDL: 44.7 mg/dL (ref >39.00)

## 2012-06-04 LAB — HEPATIC FUNCTION PANEL
AST: 30 U/L (ref 0–37)
Albumin: 4.4 g/dL (ref 3.5–5.2)
Bilirubin, Direct: 0.1 mg/dL (ref 0.0–0.3)
Total Bilirubin: 0.8 mg/dL (ref 0.3–1.2)

## 2012-06-04 NOTE — Patient Instructions (Addendum)
Your physician recommends that you continue on your current medications as directed. Please refer to the Current Medication list given to you today.  Your physician wants you to follow-up in: 1 year with Dr. Daleen Squibb. You will receive a reminder letter in the mail two months in advance. If you don't receive a letter, please call our office to schedule the follow-up appointment.  Your physician recommends that you have lab work today: cholesterol level

## 2012-06-04 NOTE — Assessment & Plan Note (Signed)
I rechecked his pressure in both arms. He's running about 135-140/85. I've encouraged him to lose weight and remain active. No change in meds.

## 2012-06-04 NOTE — Progress Notes (Signed)
HPI Mr. Matthew Hull returns today for evaluation and management of his cardiac risk factors. Other than gaining some weight he has no complaints. He is very active in bee keeping business. He denies any chest pain or angina. He said orthopnea, PND or edema.  He does not check his blood pressure at home. He is compliant with his meds. He does not smoke.  Past Medical History  Diagnosis Date  . Unspecified essential hypertension     unspecified  . Mixed hyperlipidemia     Current Outpatient Prescriptions  Medication Sig Dispense Refill  . aspirin 81 MG tablet Take 81 mg by mouth daily.        [name] atenolol-chlorthalidone (TENORETIC) 100-25 MG per tablet take 1/2 tablet by mouth once daily  15 tablet  5  . KLOR-CON M20 20 MEQ tablet take 1 tablet by mouth once daily  31 tablet  10  . Omega-3 Fatty Acids (SUPER TWIN EPA/DHA) 1250 MG CAPS Take 1 capsule by mouth daily.        [name] UNABLE TO FIND Take 1 capsule by mouth. Med Name: Ultimate H/A - joint,skin,immune support        No Known Allergies  Family History  Problem Relation Age of Onset  . Throat cancer Father     throat/neck  . Other Mother     pacemaker    History   Social History  . Marital Status: Married    Spouse Name: N/A    Number of Children: N/A  . Years of Education: N/A   Occupational History  . Not on file.   Social History Main Topics  . Smoking status: Never Smoker   . Smokeless tobacco: Not on file  . Alcohol Use: Yes  . Drug Use: No  . Sexually Active: Not on file   Other Topics Concern  . Not on file   Social History Narrative  . No narrative on file    ROS ALL NEGATIVE EXCEPT THOSE NOTED IN HPI  PE  General Appearance: well developed, well nourished in no acute distress, overweight, bearded HEENT: symmetrical face, PERRLA, good dentition  Neck: no JVD, thyromegaly, or adenopathy, trachea midline Chest: symmetric without deformity Cardiac: PMI non-displaced, RRR, soft S1-S2, no gallop or  murmur Lung: clear to ausculation and percussion Vascular: all pulses full without bruits  Abdominal: nondistended, nontender, good bowel sounds, no HSM, no bruits Extremities: no cyanosis, clubbing or edema, no sign of DVT, no varicosities  Skin: normal color, no rashes Neuro: alert and oriented x 3, non-focal Pysch: normal affect  EKG Normal sinus rhythm, normal EKG BMET    Component Value Date/Time   NA 141 05/30/2011 0859   K 3.8 05/30/2011 0859   CL 105 05/30/2011 0859   CO2 27 05/30/2011 0859   GLUCOSE 107* 05/30/2011 0859   BUN 29* 05/30/2011 0859   CREATININE 1.1 05/30/2011 0859   CALCIUM 9.3 05/30/2011 0859   GFRNONAA 73.97 05/03/2010 1034   GFRAA 79 03/01/2008 1530    Lipid Panel     Component Value Date/Time   CHOL 134 07/26/2011 0836   TRIG 85.0 07/26/2011 0836   HDL 42.40 07/26/2011 0836   CHOLHDL 3 07/26/2011 0836   VLDL 17.0 07/26/2011 0836   LDLCALC 75 07/26/2011 0836    CBC    Component Value Date/Time   WBC 7.4 01/08/2007 1153   RBC 5.28 01/08/2007 1153   HGB 17.1* 01/08/2007 1153   HCT 48.9 01/08/2007 1153   PLT 248 01/08/2007 1153  MCV 92.6 01/08/2007 1153   MCHC 34.9 01/08/2007 1153   RDW 11.7 01/08/2007 1153   MONOABS 0.5 01/08/2007 1153   EOSABS 0.1 01/08/2007 1153   BASOSABS 0.1 01/08/2007 1153

## 2012-06-04 NOTE — Assessment & Plan Note (Signed)
We'll check fasting lipids today. There were really good last time.

## 2012-06-07 ENCOUNTER — Other Ambulatory Visit: Payer: Self-pay | Admitting: Cardiology

## 2012-06-17 ENCOUNTER — Telehealth: Payer: Self-pay | Admitting: Cardiology

## 2012-06-17 ENCOUNTER — Other Ambulatory Visit: Payer: Self-pay | Admitting: Cardiology

## 2012-06-17 NOTE — Telephone Encounter (Signed)
Pt was notified of lab results.  High potassium diet was mailed to him.

## 2012-06-17 NOTE — Telephone Encounter (Signed)
New Problem:    Patient returned Debbie's call about his blood work.  Please call back.

## 2013-01-13 ENCOUNTER — Other Ambulatory Visit: Payer: Self-pay | Admitting: *Deleted

## 2013-01-13 MED ORDER — ATENOLOL-CHLORTHALIDONE 100-25 MG PO TABS: ORAL_TABLET | ORAL | Status: DC

## 2013-03-12 ENCOUNTER — Other Ambulatory Visit: Payer: Self-pay | Admitting: *Deleted

## 2013-03-12 MED ORDER — POTASSIUM CHLORIDE CRYS ER 20 MEQ PO TBCR: EXTENDED_RELEASE_TABLET | ORAL | Status: DC

## 2013-05-28 ENCOUNTER — Encounter: Payer: Self-pay | Admitting: Cardiology

## 2013-05-28 ENCOUNTER — Ambulatory Visit (INDEPENDENT_AMBULATORY_CARE_PROVIDER_SITE_OTHER): Payer: BC Managed Care – PPO | Admitting: Cardiology

## 2013-05-28 VITALS — BP 130/86 | HR 65 | Wt 230.0 lb

## 2013-05-28 DIAGNOSIS — E785 Hyperlipidemia, unspecified: Secondary | ICD-10-CM

## 2013-05-28 DIAGNOSIS — R5381 Other malaise: Secondary | ICD-10-CM

## 2013-05-28 DIAGNOSIS — R5383 Other fatigue: Secondary | ICD-10-CM

## 2013-05-28 DIAGNOSIS — I10 Essential (primary) hypertension: Secondary | ICD-10-CM

## 2013-05-28 LAB — CBC WITH DIFFERENTIAL/PLATELET
Basophils Absolute: 0 10*3/uL (ref 0.0–0.1)
HCT: 47.8 % (ref 39.0–52.0)
Lymphs Abs: 2.4 10*3/uL (ref 0.7–4.0)
MCHC: 34.9 g/dL (ref 30.0–36.0)
MCV: 91.8 fl (ref 78.0–100.0)
Monocytes Absolute: 0.7 10*3/uL (ref 0.1–1.0)
Platelets: 245 10*3/uL (ref 150.0–400.0)
RDW: 13.2 % (ref 11.5–14.6)

## 2013-05-28 NOTE — Patient Instructions (Addendum)
You will need to have lab work today: cholesterol panel, cmp, cbc, psa, tsh.  We will call you with your results  Your physician recommends that you continue on your current medications as directed. Please refer to the Current Medication list given to you today.   You have an appointment on June 05, 2013 at 1:15pm with Dr. Laren Boom

## 2013-05-28 NOTE — Assessment & Plan Note (Signed)
We'll draw blood work today. He is fasting. I've arranged for him to followup with primary care in [place] with Dr Ivan Anchors.

## 2013-05-28 NOTE — Progress Notes (Signed)
HPI Matthew Hull returns today for evaluation and management of his cardiac risk factors. He does not have a primary care physician. He is due blood work and has fasted.  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 He denies any chest pain, shortness of breath, orthopnea, PND or edema. He has not lost any weight.  Past Medical History  Diagnosis Date  . Unspecified essential hypertension     unspecified  . Mixed hyperlipidemia     Current Outpatient Prescriptions  Medication Sig Dispense Refill  . aspirin 81 MG tablet Take 81 mg by mouth daily.        [name] atenolol-chlorthalidone (TENORETIC) 100-25 MG per tablet take 1/2 tablet by mouth once daily  15 tablet  5  . Omega-3 Fatty Acids (SUPER TWIN EPA/DHA) 1250 MG CAPS Take 1 capsule by mouth daily.        . potassium chloride SA (KLOR-CON M20) 20 MEQ tablet take 1 tablet by mouth once daily  30 tablet  5  . UNABLE TO FIND Take 1 capsule by mouth. Med Name: Ultimate H/A - joint,skin,immune support       No current facility-administered medications for this visit.    No Known Allergies  Family History  Problem Relation Age of Onset  . Throat cancer Father     throat/neck  . Other Mother     pacemaker    History   Social History  . Marital Status: Married    Spouse Name: N/A    Number of Children: N/A  . Years of Education: N/A   Occupational History  . Not on file.   Social History Main Topics  . Smoking status: Never Smoker   . Smokeless tobacco: Not on file  . Alcohol Use: Yes  . Drug Use: No  . Sexual Activity: Not on file   Other Topics Concern  . Not on file   Social History Narrative  . No narrative on file    ROS ALL NEGATIVE EXCEPT THOSE NOTED IN HPI  PE  General Appearance: well developed, well nourished in no acute distress, overweight HEENT: symmetrical face, PERRLA, good dentition  Neck: no JVD, thyromegaly, or adenopathy, trachea midline Chest: symmetric without deformity Cardiac: PMI  non-displaced, RRR, normal S1, S2, no gallop or murmur Lung: clear to ausculation and percussion Vascular: all pulses full without bruits  Abdominal: nondistended, nontender, good bowel sounds, no HSM, no bruits Extremities: no cyanosis, clubbing or edema, no sign of DVT, no varicosities  Skin: normal color, no rashes Neuro: alert and oriented x 3, non-focal Pysch: normal affect  EKG Normal sinus rhythm, normal EKG  BMET    Component Value Date/Time   NA 136 06/04/2012 1208   K 3.5 06/04/2012 1208   CL 102 06/04/2012 1208   CO2 26 06/04/2012 1208   GLUCOSE 86 06/04/2012 1208   BUN 26* 06/04/2012 1208   CREATININE 1.1 06/04/2012 1208   CALCIUM 9.5 06/04/2012 1208   GFRNONAA 73.97 05/03/2010 1034   GFRAA 79 03/01/2008 1530    Lipid Panel     Component Value Date/Time   CHOL 204* 06/04/2012 1208   TRIG 168.0* 06/04/2012 1208   HDL 44.70 06/04/2012 1208   CHOLHDL 5 06/04/2012 1208   VLDL 33.6 06/04/2012 1208   LDLCALC 75 07/26/2011 0836    CBC    Component Value Date/Time   WBC 7.4 01/08/2007 1153   RBC 5.28 01/08/2007 1153   HGB 17.1* 01/08/2007 1153   HCT 48.9 01/08/2007 1153  PLT 248 01/08/2007 1153   MCV 92.6 01/08/2007 1153   MCHC 34.9 01/08/2007 1153   RDW 11.7 01/08/2007 1153   MONOABS 0.5 01/08/2007 1153   EOSABS 0.1 01/08/2007 1153   BASOSABS 0.1 01/08/2007 1153

## 2013-05-28 NOTE — Assessment & Plan Note (Signed)
Better control today. We'll check compress metabolic profile, TSH. He will need followup with primary care. I've made arrangements. He only needs to  see cardiology on a when necessary basis.

## 2013-05-29 LAB — LIPID PANEL
Total CHOL/HDL Ratio: 4
Triglycerides: 77 mg/dL (ref 0.0–149.0)

## 2013-05-29 LAB — HEPATIC FUNCTION PANEL
AST: 28 U/L (ref 0–37)
Alkaline Phosphatase: 63 U/L (ref 39–117)
Bilirubin, Direct: 0.1 mg/dL (ref 0.0–0.3)
Total Bilirubin: 0.8 mg/dL (ref 0.3–1.2)

## 2013-05-29 LAB — BASIC METABOLIC PANEL
CO2: 28 mEq/L (ref 19–32)
Calcium: 9.5 mg/dL (ref 8.4–10.5)
Creatinine, Ser: 1.1 mg/dL (ref 0.4–1.5)
Glucose, Bld: 78 mg/dL (ref 70–99)

## 2013-05-29 LAB — PSA: PSA: 1.6 ng/mL (ref 0.10–4.00)

## 2013-06-05 ENCOUNTER — Encounter: Payer: Self-pay | Admitting: Family Medicine

## 2013-06-05 ENCOUNTER — Ambulatory Visit (INDEPENDENT_AMBULATORY_CARE_PROVIDER_SITE_OTHER): Payer: BC Managed Care – PPO | Admitting: Family Medicine

## 2013-06-05 VITALS — BP 157/94 | HR 73 | Ht 71.0 in | Wt 231.0 lb

## 2013-06-05 DIAGNOSIS — E785 Hyperlipidemia, unspecified: Secondary | ICD-10-CM

## 2013-06-05 DIAGNOSIS — I10 Essential (primary) hypertension: Secondary | ICD-10-CM

## 2013-06-05 MED ORDER — ZOSTER VACCINE LIVE 19400 UNT/0.65ML ~~LOC~~ SOLR: 0.6500 mL | Freq: Once | SUBCUTANEOUS | Status: DC

## 2013-06-05 NOTE — Progress Notes (Signed)
CC: Matthew Hull is a 67 y.o. 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 He has been taking his blood pressure at home all of which have been in pre- hypertensive state.  He stays active in his yard working with his animals on a daily basis. He tries to watch salt in his diet admits room for improvement. Denies known side effects from above medication  Patient reports a history of hyperlipidemia he is a nonsmoker there is no family history of cardiac disease he has never been a diabetic he has no personal cardiac disease history. LDL last week was 131 he takes fish oil but nothing else for cholesterol. He was on simvastatin in the past but disliked the idea of taking medication, he is very well thought out reservations about fear of myalgias and liver inflammation associated with statins  Review of Systems - General ROS: negative for - chills, fever, night sweats, weight gain or weight loss Ophthalmic ROS: negative for - decreased vision Psychological ROS: negative for - anxiety or depression ENT ROS: negative for - hearing change, nasal congestion, tinnitus or allergies Hematological and Lymphatic ROS: negative for - bleeding problems, bruising or swollen lymph nodes Breast ROS: negative Respiratory ROS: no cough, shortness of breath, or wheezing Cardiovascular ROS: no chest pain or dyspnea on exertion Gastrointestinal ROS: no abdominal pain, change in bowel habits, or black or bloody stools Genito-Urinary ROS: negative for - genital discharge, genital ulcers, incontinence or abnormal bleeding from genitals Musculoskeletal ROS: negative for - joint pain or muscle pain Neurological ROS: negative for - headaches or memory loss Dermatological ROS: negative  for lumps, mole changes, rash and skin lesion changes Past Medical History  Diagnosis Date  . Unspecified essential hypertension     unspecified  . Mixed hyperlipidemia      Family History  Problem Relation Age of Onset  . Throat cancer Father     throat/neck  . Other Mother     pacemaker     History  Substance Use Topics  . Smoking status: Never Smoker   . Smokeless tobacco: Not on file  . Alcohol Use: Yes     Objective: Filed Vitals:   06/05/13 1320  BP: 157/94  Pulse: 73    General: Alert and Oriented, No Acute Distress HEENT: Pupils equal, round, reactive to light. Conjunctivae clear.  Moist mucous membranes pharynx unremarkable Lungs: Clear to auscultation bilaterally, no wheezing/ronchi/rales.  Comfortable work of breathing. Good air movement. Cardiac: Regular rate and rhythm. Normal S1/S2.  No murmurs, rubs, nor gallops.   Abdomen: Normal bowel sounds, soft and non tender without palpable masses. Extremities: No peripheral edema.  Strong peripheral pulses.  Mental Status: No depression, anxiety, nor agitation. Skin: Warm and dry.  Assessment & Plan: Fermin was seen today for establish care.  Diagnoses and associated orders for this visit:  HYPERLIPIDEMIA-MIXED  HYPERTENSION, UNSPECIFIED  Other Orders - zoster vaccine live, PF, (ZOSTAVAX) 40981 UNT/0.65ML injection; Inject 19,400 Units into the skin once.    Hyperlipidemia: Uncontrolled Discussed a goal LDL of less than 130, he has strong reservations against statin medications we discussed the DASH diet which he will focus on for the next 3 months with a recheck in 3 months Hypertension: Uncontrolled chronic condition, he is quite motivated  about salt restriction and exercise, given that he was not stage I hypertension or above at his cardiology visit last week and reporting pre-hypertension readings at home we'll hold off on titrating medication he agrees to return if blood pressures are noticed 140/90 or  above at home. Potassium and renal function appropriate last week  He has never had a colonoscopy, he's going to ask his wife were she had hers and will contact us regarding referral to that location  He has never had a zoster vaccine a prescription was given to him today to receive his local pharmacy  We discussed his overall favorable labs including metabolic panel, hepatic function panel, TSH, PSA.  Return in about 3 months (around 09/04/2013).

## 2013-06-05 NOTE — Patient Instructions (Addendum)
DASH Diet  The DASH diet stands for "Dietary Approaches to Stop Hypertension." It is a healthy eating plan that has been shown to reduce high blood pressure (hypertension) in as little as 14 days, while also possibly providing other significant health benefits. These other health benefits include reducing the risk of breast cancer after menopause and reducing the risk of type 2 diabetes, heart disease, colon cancer, and stroke. Health benefits also include weight loss and slowing kidney failure in patients with chronic kidney disease.   DIET GUIDELINES  · Limit salt (sodium). Your diet should contain less than 1500 mg of sodium daily.  · Limit refined or processed carbohydrates. Your diet should include mostly whole grains. Desserts and added sugars should be used sparingly.  · Include small amounts of heart-healthy fats. These types of fats include nuts, oils, and tub margarine. Limit saturated and trans fats. These fats have been shown to be harmful in the body.  CHOOSING FOODS   The following food groups are based on a 2000 calorie diet. See your Registered Dietitian for individual calorie needs.  Grains and Grain Products (6 to 8 servings daily)  · Eat More Often: Whole-wheat bread, brown rice, whole-grain or wheat pasta, quinoa, popcorn without added fat or salt (air popped).  · Eat Less Often: White bread, white pasta, white rice, cornbread.  Vegetables (4 to 5 servings daily)  · Eat More Often: Fresh, frozen, and canned vegetables. Vegetables may be raw, steamed, roasted, or grilled with a minimal amount of fat.  · Eat Less Often/Avoid: Creamed or fried vegetables. Vegetables in a cheese sauce.  Fruit (4 to 5 servings daily)  · Eat More Often: All fresh, canned (in natural juice), or frozen fruits. Dried fruits without added sugar. One hundred percent fruit juice (½ cup [237 mL] daily).  · Eat Less Often: Dried fruits with added sugar. Canned fruit in light or heavy syrup.  Lean Meats, Fish, and Poultry (2  servings or less daily. One serving is 3 to 4 oz [85-114 g]).  · Eat More Often: Ninety percent or leaner ground beef, tenderloin, sirloin. Round cuts of beef, chicken breast, turkey breast. All fish. Grill, bake, or broil your meat. Nothing should be fried.  · Eat Less Often/Avoid: Fatty cuts of meat, turkey, or chicken leg, thigh, or wing. Fried cuts of meat or fish.  Dairy (2 to 3 servings)  · Eat More Often: Low-fat or fat-free milk, low-fat plain or light yogurt, reduced-fat or part-skim cheese.  · Eat Less Often/Avoid: Milk (whole, 2%). Whole milk yogurt. Full-fat cheeses.  Nuts, Seeds, and Legumes (4 to 5 servings per week)  · Eat More Often: All without added salt.  · Eat Less Often/Avoid: Salted nuts and seeds, canned beans with added salt.  Fats and Sweets (limited)  · Eat More Often: Vegetable oils, tub margarines without trans fats, sugar-free gelatin. Mayonnaise and salad dressings.  · Eat Less Often/Avoid: Coconut oils, palm oils, butter, stick margarine, cream, half and half, cookies, candy, pie.  FOR MORE INFORMATION  The Dash Diet Eating Plan: www.dashdiet.org  Document Released: 09/06/2011 Document Revised: 12/10/2011 Document Reviewed: 09/06/2011  Exit[employer]® Patient Information ©2014 [employer].

## 2013-07-30 ENCOUNTER — Other Ambulatory Visit: Payer: Self-pay | Admitting: *Deleted

## 2013-07-30 MED ORDER — ATENOLOL-CHLORTHALIDONE 100-25 MG PO TABS: ORAL_TABLET | ORAL | Status: DC

## 2013-11-18 ENCOUNTER — Other Ambulatory Visit: Payer: Self-pay | Admitting: *Deleted

## 2013-11-18 MED ORDER — POTASSIUM CHLORIDE CRYS ER 20 MEQ PO TBCR: EXTENDED_RELEASE_TABLET | ORAL | Status: DC

## 2013-12-18 ENCOUNTER — Other Ambulatory Visit: Payer: Self-pay | Admitting: *Deleted

## 2013-12-18 MED ORDER — ATENOLOL-CHLORTHALIDONE 100-25 MG PO TABS: ORAL_TABLET | ORAL | Status: DC

## 2014-02-16 ENCOUNTER — Encounter: Payer: Self-pay | Admitting: Family Medicine

## 2014-02-16 ENCOUNTER — Ambulatory Visit (INDEPENDENT_AMBULATORY_CARE_PROVIDER_SITE_OTHER): Payer: BC Managed Care – PPO | Admitting: Family Medicine

## 2014-02-16 VITALS — BP 154/98 | HR 71 | Wt 233.0 lb

## 2014-02-16 DIAGNOSIS — L259 Unspecified contact dermatitis, unspecified cause: Secondary | ICD-10-CM

## 2014-02-16 DIAGNOSIS — L309 Dermatitis, unspecified: Secondary | ICD-10-CM

## 2014-02-16 DIAGNOSIS — I10 Essential (primary) hypertension: Secondary | ICD-10-CM

## 2014-02-16 MED ORDER — TRIAMCINOLONE ACETONIDE 0.1 % EX CREA: TOPICAL_CREAM | CUTANEOUS | Status: DC

## 2014-02-16 MED ORDER — ATENOLOL-CHLORTHALIDONE 100-25 MG PO TABS: ORAL_TABLET | ORAL | Status: DC

## 2014-02-16 NOTE — Patient Instructions (Signed)
Call me with blood pressures over the next week.  Looking for blood pressures below 140/90.

## 2014-02-16 NOTE — Progress Notes (Signed)
CC: Matthew Hull is a 68 y.o. male is here for Hypertension   Subjective: HPI:  Followup hypertension: Continues on atenolol/chlorthalidone half a dose on a daily basis. No outside blood pressures report. Denies shortness of breath orthopnea peripheral edema nor motor or sensory disturbances. Denies chest pain, falls nor lightheadedness.  Took today's medication 20 minutes prior to our encounter  Complains of flaking and swelling of the right hand localized on the dorsal surface of the thumb just proximal to the nailbed, additionally on the palmar surface of the middle finger. Symptoms are worse the more he uses his hands with gardening or fixing electronics.  It is slightly painful, a nuisance. Denies any recent or remote fevers, chills, discharge, nor redness at the site of swelling.    Review Of Systems Outlined In HPI  Past Medical History  Diagnosis Date  . Unspecified essential hypertension     unspecified  . Mixed hyperlipidemia     Past Surgical History  Procedure Laterality Date  . I&d of skin and subcutaneous tissue, bone and nonviable, necrotic tissure, right middle finger    . Curettage osteomyelitis focus; right middle finger     Family History  Problem Relation Age of Onset  . Throat cancer Father     throat/neck  . Other Mother     pacemaker    History   Social History  . Marital Status: Married    Spouse Name: N/A    Number of Children: N/A  . Years of Education: N/A   Occupational History  . Not on file.   Social History Main Topics  . Smoking status: Never Smoker   . Smokeless tobacco: Not on file  . Alcohol Use: Yes  . Drug Use: No  . Sexual Activity: Not on file   Other Topics Concern  . Not on file   Social History Narrative  . No narrative on file     Objective: BP 154/98  Pulse 71  Wt 233 lb (105.688 kg)  General: Alert and Oriented, No Acute Distress HEENT: Pupils equal, round, reactive to light. Conjunctivae clear.  Moist mucous  membranes pharynx unremarkable Lungs: Clear to auscultation bilaterally, no wheezing/ronchi/rales.  Comfortable work of breathing. Good air movement. Cardiac: Regular rate and rhythm. Normal S1/S2.  No murmurs, rubs, nor gallops.   Extremities: No peripheral edema.  Strong peripheral pulses. On the right hand mostly on the palm aspect of the middle finger there is moderate hyperkeratosis and cracking of the skin without any erythema, mild callus on the back of the thumb just proximal to the nailbed. Mental Status: No depression, anxiety, nor agitation. Skin: Warm and dry.  Assessment & Plan: Kaimana was seen today for hypertension.  Diagnoses and associated orders for this visit:  HYPERTENSION, UNSPECIFIED - atenolol-chlorthalidone (TENORETIC) 100-25 MG per tablet; take 1/2 tablet by mouth once daily  Dermatitis - triamcinolone cream (KENALOG) 0.1 %; Apply to affected areas twice a day for up to two weeks, avoid face.    Hypertension: Uncontrolled chronic condition, this could simply be due to him not taking his blood pressure medication until 20 minutes ago. I've asked him to check his blood pressure at home twice a day for the next week and provide me with these readings in order to determine if we need to increase his tenoretic. Dermatitis: Apply triamcinolone cream for the next 2 weeks to areas of concern on the right hand.  Return in about 4 weeks (around 03/16/2014).

## 2014-08-03 ENCOUNTER — Other Ambulatory Visit: Payer: Self-pay | Admitting: *Deleted

## 2014-08-03 MED ORDER — POTASSIUM CHLORIDE CRYS ER 20 MEQ PO TBCR: EXTENDED_RELEASE_TABLET | ORAL | Status: DC

## 2014-08-06 DIAGNOSIS — Z23 Encounter for immunization: Secondary | ICD-10-CM | POA: Diagnosis not present

## 2014-10-08 ENCOUNTER — Telehealth: Payer: Self-pay | Admitting: *Deleted

## 2014-10-08 ENCOUNTER — Other Ambulatory Visit: Payer: Self-pay

## 2014-10-08 MED ORDER — POTASSIUM CHLORIDE CRYS ER 20 MEQ PO TBCR: EXTENDED_RELEASE_TABLET | ORAL | Status: DC

## 2014-10-08 NOTE — Telephone Encounter (Signed)
Pt left a message that his rx bottle said f/u needed before future refills and he wanted to discuss this. Called and left a message that he does need to schedule a f/u appt since the last time we saw him was last may and he is overdue

## 2014-10-11 ENCOUNTER — Encounter: Payer: Self-pay | Admitting: Family Medicine

## 2014-10-11 ENCOUNTER — Ambulatory Visit (INDEPENDENT_AMBULATORY_CARE_PROVIDER_SITE_OTHER): Payer: Medicare Other | Admitting: Family Medicine

## 2014-10-11 VITALS — BP 140/91 | HR 66 | Wt 216.0 lb

## 2014-10-11 DIAGNOSIS — E876 Hypokalemia: Secondary | ICD-10-CM | POA: Diagnosis not present

## 2014-10-11 DIAGNOSIS — E785 Hyperlipidemia, unspecified: Secondary | ICD-10-CM | POA: Diagnosis not present

## 2014-10-11 DIAGNOSIS — I10 Essential (primary) hypertension: Secondary | ICD-10-CM | POA: Insufficient documentation

## 2014-10-11 LAB — LIPID PANEL
Cholesterol: 215 mg/dL — ABNORMAL HIGH (ref 0–200)
HDL: 43 mg/dL (ref >39)
LDL Cholesterol: 155 mg/dL — ABNORMAL HIGH (ref 0–99)
Total CHOL/HDL Ratio: 5 Ratio
Triglycerides: 85 mg/dL (ref <150)
VLDL: 17 mg/dL (ref 0–40)

## 2014-10-11 LAB — BASIC METABOLIC PANEL WITH GFR
BUN: 16 mg/dL (ref 6–23)
CHLORIDE: 102 meq/L (ref 96–112)
CO2: 30 mEq/L (ref 19–32)
CREATININE: 0.93 mg/dL (ref 0.50–1.35)
Calcium: 9.8 mg/dL (ref 8.4–10.5)
GFR, EST NON AFRICAN AMERICAN: 84 mL/min
Glucose, Bld: 90 mg/dL (ref 70–99)
Potassium: 4.6 mEq/L (ref 3.5–5.3)
Sodium: 137 mEq/L (ref 135–145)

## 2014-10-11 MED ORDER — ATENOLOL-CHLORTHALIDONE 100-25 MG PO TABS: ORAL_TABLET | ORAL | Status: DC

## 2014-10-11 MED ORDER — POTASSIUM CHLORIDE CRYS ER 20 MEQ PO TBCR: EXTENDED_RELEASE_TABLET | ORAL | Status: DC

## 2014-10-11 NOTE — Progress Notes (Signed)
CC: Matthew Hull is a 69 y.o. male is here for Hypertension   Subjective: HPI:  Follow-up essential hypertension: Continues on atenolol-chlorthalidone half a tab daily. He has a few outside blood pressures to report all of which are in the prehypertensive range. He denies side effects or intolerance to the above medication. denies chest pain shortness of breath orthopnea nor motor or sensory disturbances  Follow-up hyperlipidemia since I saw him last he had not been on any cholesterol-lowering medication. 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 Denies leg claudication  Follow-up hypokalemia. Taking potassium supplementation on a daily basis without muscle cramps, irregular heartbeat nor rapid heartbeat.  Review Of Systems Outlined In HPI  Past Medical History  Diagnosis Date  . Unspecified essential hypertension     unspecified  . Mixed hyperlipidemia     Past Surgical History  Procedure Laterality Date  . I&d of skin and subcutaneous tissue, bone and nonviable, necrotic tissure, right middle finger    . Curettage osteomyelitis focus; right middle finger     Family History  Problem Relation Age of Onset  . Throat cancer Father     throat/neck  . Other Mother     pacemaker    History   Social History  . Marital Status: Married    Spouse Name: N/A    Number of Children: N/A  . Years of Education: N/A   Occupational History  . Not on file.   Social History Main Topics  . Smoking status: Never Smoker   . Smokeless tobacco: Not on file  . Alcohol Use: Yes  . Drug Use: No  . Sexual Activity: Not on file   Other Topics Concern  . Not on file   Social History Narrative     Objective: BP 140/91 mmHg  Pulse 66  Wt 216 lb (97.977 kg)  General: Alert and Oriented, No Acute Distress HEENT: Pupils equal, round, reactive to light. Conjunctivae clear.  Moist mucous membranes pharynx unremarkable mild obesity Lungs: Clear to  auscultation bilaterally, no wheezing/ronchi/rales.  Comfortable work of breathing. Good air movement. Cardiac: Regular rate and rhythm. Normal S1/S2.  No murmurs, rubs, nor gallops.   Abdomen: Normal bowel sounds, soft and non tender without palpable masses. Extremities: No peripheral edema.  Strong peripheral pulses.  Mental Status: No depression, anxiety, nor agitation. Skin: Warm and dry.  Assessment & Plan: Matthew Hull was seen today for hypertension.  Diagnoses and associated orders for this visit:  Hyperlipidemia - Lipid panel  Essential hypertension - atenolol-chlorthalidone (TENORETIC) 100-25 MG per tablet; take full tablet by mouth once daily - BASIC METABOLIC PANEL WITH GFR  Hypokalemia - potassium chloride SA (KLOR-CON M20) 20 MEQ tablet; take 1 tablet by mouth once daily - BASIC METABOLIC PANEL WITH GFR    hyperlipidemia: Due for repeat lipid panel to calculate 10 year American Heart Association risk of cardiovascular event Essential hypertension: Uncontrolled chronic condition increasing Tenoretic Hypokalemia: Clinically controlled but due for metabolic panel with renal function. Return in about 3 months (around 01/10/2015) for Blood Presssure.

## 2014-10-12 ENCOUNTER — Telehealth: Payer: Self-pay | Admitting: Family Medicine

## 2014-10-12 DIAGNOSIS — E785 Hyperlipidemia, unspecified: Secondary | ICD-10-CM

## 2014-10-12 MED ORDER — ATORVASTATIN CALCIUM 20 MG PO TABS
20.0000 mg | ORAL_TABLET | Freq: Every day | ORAL | Status: DC
Start: 2014-10-12 — End: 2015-12-19

## 2014-10-12 NOTE — Telephone Encounter (Signed)
Seth Bake, Will you please let patient know that kidney function and blood sugar were normal.  His LDL cholesterol was elevated with an 23% estimated risk of having a heart attack in the next 10 years.  This risk can be reduced by starting a cholesterol lowering medication called atorvastatin that I've sent to his [facility].  I'd recommend f/u in 3 months to recheck cholesterol.

## 2014-10-12 NOTE — Telephone Encounter (Signed)
Left message on vm with below reccomendations

## 2015-01-10 ENCOUNTER — Ambulatory Visit (INDEPENDENT_AMBULATORY_CARE_PROVIDER_SITE_OTHER): Payer: Medicare Other | Admitting: Family Medicine

## 2015-01-10 ENCOUNTER — Encounter: Payer: Self-pay | Admitting: Family Medicine

## 2015-01-10 VITALS — BP 129/83 | HR 56 | Wt 214.0 lb

## 2015-01-10 DIAGNOSIS — I10 Essential (primary) hypertension: Secondary | ICD-10-CM

## 2015-01-10 DIAGNOSIS — E785 Hyperlipidemia, unspecified: Secondary | ICD-10-CM

## 2015-01-10 MED ORDER — ATENOLOL-CHLORTHALIDONE 100-25 MG PO TABS: ORAL_TABLET | ORAL | Status: DC

## 2015-01-10 NOTE — Progress Notes (Signed)
CC: Matthew Hull is a 69 y.o. male is here for Hypertension   Subjective: HPI:  Follow essential hypertension: At his last visit we increased Tenoretic. He's currently taking a full tablet daily. He believes that ever since he went up on this dosage he's felt a sensation almost like fatigue or tiredness. It's mildly interfering with his quality of life and present on a daily basis. Nothing seems to make it better or worse. No interventions as of yet. He's never had this before. Denies any other known side effects. Denies chest pain shortness of breath orthopnea nor peripheral edema  Follow-up hyperlipidemia: Cholesterol was checked last he was found to have a 10 year risk of 26% with respect to a heart attack. He's began taking atorvastatin on a daily basis and cutting back portions when eating. Denies any myalgias or right upper quadrant pain.   Review Of Systems Outlined In HPI  Past Medical History  Diagnosis Date  . Unspecified essential hypertension     unspecified  . Mixed hyperlipidemia     Past Surgical History  Procedure Laterality Date  . I&d of skin and subcutaneous tissue, bone and nonviable, necrotic tissure, right middle finger    . Curettage osteomyelitis focus; right middle finger     Family History  Problem Relation Age of Onset  . Throat cancer Father     throat/neck  . Other Mother     pacemaker    History   Social History  . Marital Status: Married    Spouse Name: N/A  . Number of Children: N/A  . Years of Education: N/A   Occupational History  . Not on file.   Social History Main Topics  . Smoking status: Never Smoker   . Smokeless tobacco: Not on file  . Alcohol Use: Yes  . Drug Use: No  . Sexual Activity: Not on file   Other Topics Concern  . Not on file   Social History Narrative     Objective: BP 129/83 mmHg  Pulse 56  Wt 214 lb (97.07 kg)  General: Alert and Oriented, No Acute Distress HEENT: Pupils equal, round, reactive to  light. Conjunctivae clear.  Moist mucous membranes Lungs: Clear to auscultation bilaterally, no wheezing/ronchi/rales.  Comfortable work of breathing. Good air movement. Cardiac: Regular rate and rhythm. Normal S1/S2.  No murmurs, rubs, nor gallops.  No carotid bruit Extremities: No peripheral edema.  Strong peripheral pulses.  Mental Status: No depression, anxiety, nor agitation. Skin: Warm and dry.  Assessment & Plan: Matthew Hull was seen today for hypertension.  Diagnoses and all orders for this visit:  Hyperlipidemia Orders: -     Lipid panel  Essential hypertension Orders: -     atenolol-chlorthalidone (TENORETIC) 100-25 MG per tablet; Half tab by mouth twice a day.   Hyperlipidemia: Clinically controlled due for repeat lipid panel after starting atorvastatin continue current dose pending results Essential hypertension: Controlled, in hopes of minimizing possible side effect of fatigue he will take half a tablet in the morning have a tablet in the evening. If this is not beneficial the next option would be go to a 50-25 formulation and take 1+1/2 tablets daily.   Return in about 6 months (around 07/12/2015).

## 2015-01-11 LAB — LIPID PANEL
CHOLESTEROL: 132 mg/dL (ref 0–200)
HDL: 40 mg/dL (ref >=40)
LDL Cholesterol: 73 mg/dL (ref 0–99)
TRIGLYCERIDES: 93 mg/dL (ref <150)
Total CHOL/HDL Ratio: 3.3 Ratio
VLDL: 19 mg/dL (ref 0–40)

## 2015-09-07 DIAGNOSIS — H524 Presbyopia: Secondary | ICD-10-CM | POA: Diagnosis not present

## 2015-09-07 DIAGNOSIS — H52223 Regular astigmatism, bilateral: Secondary | ICD-10-CM | POA: Diagnosis not present

## 2015-09-07 DIAGNOSIS — H35413 Lattice degeneration of retina, bilateral: Secondary | ICD-10-CM | POA: Diagnosis not present

## 2015-09-07 DIAGNOSIS — H2513 Age-related nuclear cataract, bilateral: Secondary | ICD-10-CM | POA: Diagnosis not present

## 2015-09-07 DIAGNOSIS — H5213 Myopia, bilateral: Secondary | ICD-10-CM | POA: Diagnosis not present

## 2015-12-01 ENCOUNTER — Other Ambulatory Visit: Payer: Self-pay

## 2015-12-01 DIAGNOSIS — I10 Essential (primary) hypertension: Secondary | ICD-10-CM

## 2015-12-01 MED ORDER — ATENOLOL-CHLORTHALIDONE 100-25 MG PO TABS: ORAL_TABLET | ORAL | Status: DC

## 2015-12-19 ENCOUNTER — Ambulatory Visit (INDEPENDENT_AMBULATORY_CARE_PROVIDER_SITE_OTHER): Payer: Medicare Other | Admitting: Family Medicine

## 2015-12-19 ENCOUNTER — Encounter: Payer: Self-pay | Admitting: Family Medicine

## 2015-12-19 VITALS — BP 160/92 | HR 64 | Wt 217.0 lb

## 2015-12-19 DIAGNOSIS — E785 Hyperlipidemia, unspecified: Secondary | ICD-10-CM

## 2015-12-19 DIAGNOSIS — I10 Essential (primary) hypertension: Secondary | ICD-10-CM | POA: Diagnosis not present

## 2015-12-19 MED ORDER — ATENOLOL-CHLORTHALIDONE 100-25 MG PO TABS: ORAL_TABLET | ORAL | Status: DC

## 2015-12-19 NOTE — Progress Notes (Signed)
CC: Matthew Hull is a 70 y.o. male is here for Hypertension   Subjective: HPI:   follow-up essential hypertension: Only taking half a dose of tenoretic  Due to forgetfulness in the evening. Denies chest pain shortness of breath orthopnea nor peripheral edema. Taking aspirin daily without side effects.  Follow-up hyperlipidemia: He took atorvastatin and it proved to be very beneficial for his LDL cholesterol however he developed intolerable cramping in the quadricep muscles that went away after stopping the medication.   Review Of Systems Outlined In HPI  Past Medical History  Diagnosis Date  . Unspecified essential hypertension     unspecified  . Mixed hyperlipidemia     Past Surgical History  Procedure Laterality Date  . I&d of skin and subcutaneous tissue, bone and nonviable, necrotic tissure, right middle finger    . Curettage osteomyelitis focus; right middle finger     Family History  Problem Relation Age of Onset  . Throat cancer Father     throat/neck  . Other Mother     pacemaker    Social History   Social History  . Marital Status: Married    Spouse Name: N/A  . Number of Children: N/A  . Years of Education: N/A   Occupational History  . Not on file.   Social History Main Topics  . Smoking status: Never Smoker   . Smokeless tobacco: Not on file  . Alcohol Use: Yes  . Drug Use: No  . Sexual Activity: Not on file   Other Topics Concern  . Not on file   Social History Narrative     Objective: BP 148/94 mmHg  Pulse 100  Wt 217 lb (98.431 kg)  General: Alert and Oriented, No Acute Distress HEENT: Pupils equal, round, reactive to light. Conjunctivae clear.   Lungs: Clear to auscultation bilaterally, no wheezing/ronchi/rales.  Comfortable work of breathing. Good air movement. Cardiac: Regular rate and rhythm. Normal S1/S2.  No murmurs, rubs, nor gallops.   Extremities: No peripheral edema.  Strong peripheral pulses.  Mental Status: No depression,  anxiety, nor agitation. Skin: Warm and dry.  Assessment & Plan: Matthew Hull was seen today for hypertension.  Diagnoses and all orders for this visit:  Essential hypertension -     atenolol-chlorthalidone (TENORETIC) 100-25 MG tablet; Half tab by mouth twice a day.  Hyperlipidemia   Essential hypertension: Uncontrolled chronic condition, adding half a tablet of tenoretic to his bedtime medication regimen. Hyperlipidemia: Continue visual capsules, is not interested in pursuing any more statins due to side effects.   Return for 3-6 month BP follow up.

## 2016-03-20 ENCOUNTER — Ambulatory Visit (INDEPENDENT_AMBULATORY_CARE_PROVIDER_SITE_OTHER): Payer: Medicare Other | Admitting: Family Medicine

## 2016-03-20 ENCOUNTER — Encounter: Payer: Self-pay | Admitting: Family Medicine

## 2016-03-20 VITALS — BP 135/85 | HR 65 | Wt 227.0 lb

## 2016-03-20 DIAGNOSIS — Z23 Encounter for immunization: Secondary | ICD-10-CM

## 2016-03-20 DIAGNOSIS — I10 Essential (primary) hypertension: Secondary | ICD-10-CM

## 2016-03-20 MED ORDER — ATENOLOL-CHLORTHALIDONE 100-25 MG PO TABS: ORAL_TABLET | ORAL | Status: DC

## 2016-03-20 NOTE — Progress Notes (Signed)
CC: Matthew Hull is a 70 y.o. male is here for Hypertension   Subjective: HPI:  Follow-up essential hypertension: Is currently taking atenolol/chlorthalidone half tablet twice a day. No outside blood pressures reported. No chest pain shortness of breath orthopnea or peripheral edema nor lightheadedness.   Review Of Systems Outlined In HPI  Past Medical History  Diagnosis Date  . Unspecified essential hypertension     unspecified  . Mixed hyperlipidemia     Past Surgical History  Procedure Laterality Date  . I&d of skin and subcutaneous tissue, bone and nonviable, necrotic tissure, right middle finger    . Curettage osteomyelitis focus; right middle finger     Family History  Problem Relation Age of Onset  . Throat cancer Father     throat/neck  . Other Mother     pacemaker    Social History   Social History  . Marital Status: Married    Spouse Name: N/A  . Number of Children: N/A  . Years of Education: N/A   Occupational History  . Not on file.   Social History Main Topics  . Smoking status: Never Smoker   . Smokeless tobacco: Not on file  . Alcohol Use: Yes  . Drug Use: No  . Sexual Activity: Not on file   Other Topics Concern  . Not on file   Social History Narrative     Objective: BP 135/85 mmHg  Pulse 65  Wt 227 lb (102.967 kg)  General: Alert and Oriented, No Acute Distress HEENT: Pupils equal, round, reactive to light. Conjunctivae clear. Moist mucous membranes Lungs: Clear to auscultation bilaterally, no wheezing/ronchi/rales.  Comfortable work of breathing. Good air movement. Cardiac: Regular rate and rhythm. Normal S1/S2.  No murmurs, rubs, nor gallops.   Extremities: No peripheral edema.  Strong peripheral pulses.  Mental Status: No depression, anxiety, nor agitation. Skin: Warm and dry.  Assessment & Plan: [name] was seen today for hypertension.  Diagnoses and all orders for this visit:  Essential hypertension -      atenolol-chlorthalidone (TENORETIC) 100-25 MG tablet; Half tab by mouth twice a day.  Need for prophylactic vaccination against Streptococcus pneumoniae (pneumococcus) -     Pneumococcal conjugate vaccine 13-valent   Blood pressure has improved with current dosing regimen of Tenoretic, continue taking this twice a day and follow-up in 3-6 months. We discussed getting the Zostavax next time he comes in.   Return in about 6 months (around 09/19/2016) for BP.

## 2016-07-12 ENCOUNTER — Other Ambulatory Visit: Payer: Self-pay | Admitting: *Deleted

## 2016-07-12 DIAGNOSIS — I10 Essential (primary) hypertension: Secondary | ICD-10-CM

## 2016-07-12 MED ORDER — ATENOLOL-CHLORTHALIDONE 100-25 MG PO TABS: ORAL_TABLET | ORAL | 0 refills | Status: DC

## 2016-07-23 ENCOUNTER — Encounter: Payer: Self-pay | Admitting: Osteopathic Medicine

## 2016-07-23 ENCOUNTER — Ambulatory Visit (INDEPENDENT_AMBULATORY_CARE_PROVIDER_SITE_OTHER): Payer: Medicare Other | Admitting: Osteopathic Medicine

## 2016-07-23 VITALS — BP 150/95 | HR 66 | Ht 72.0 in | Wt 233.0 lb

## 2016-07-23 DIAGNOSIS — E785 Hyperlipidemia, unspecified: Secondary | ICD-10-CM

## 2016-07-23 DIAGNOSIS — I10 Essential (primary) hypertension: Secondary | ICD-10-CM

## 2016-07-23 DIAGNOSIS — E876 Hypokalemia: Secondary | ICD-10-CM | POA: Diagnosis not present

## 2016-07-23 MED ORDER — ATENOLOL-CHLORTHALIDONE 50-25 MG PO TABS: 1.0000 | ORAL_TABLET | Freq: Two times a day (BID) | ORAL | 3 refills | Status: DC

## 2016-07-23 MED ORDER — POTASSIUM CHLORIDE CRYS ER 20 MEQ PO TBCR: EXTENDED_RELEASE_TABLET | ORAL | 1 refills | Status: DC

## 2016-07-23 NOTE — Patient Instructions (Signed)
If Rx still too expensive, call us and I will prescribe alternative regimen

## 2016-07-23 NOTE — Progress Notes (Signed)
HPI: Matthew Hull is a 70 y.o. male  who presents to [facility] today, 07/23/16,  for chief complaint of:  Chief Complaint  Patient presents with  . Establish Care    Switching from [place]    Hypertension: No other BP medications in the past. Recently went to get atenolol/chlorthalidone 100/25 mg filled but the price on this had increased dramatically. No chest pain, pressure, shortness of breath, headache.  Hyperlipidemia: Patient was previously on atorvastatin 20 mg daily. Last cholesterol check January 2016. Patient has stopped this medication, wife is an Therapist, sports and that he probably didn't need this.  Recent records reviewed: Patient last seen 03/20/2016 for follow-up for blood pressure with Dr. Ileene Rubens. BP at that visit was 135/85. At that visit, atenolol-chlorthalidone was refilled. Last BMP and lipid panel were okay  Past medical, surgical, social and family history reviewed: Past Medical History:  Diagnosis Date  . Mixed hyperlipidemia   . Unspecified essential hypertension    unspecified   Past Surgical History:  Procedure Laterality Date  . Curettage osteomyelitis focus; right middle finger    . I&D of skin and subcutaneous tissue, bone and nonviable, necrotic tissure, right middle finger     Social History  Substance Use Topics  . Smoking status: Never Smoker  . Smokeless tobacco: Never Used  . Alcohol use Yes   Family History  Problem Relation Age of Onset  . Throat cancer Father     throat/neck  . Other Mother     pacemaker     Current medication list and allergy/intolerance information reviewed:   Current Outpatient Prescriptions on File Prior to Visit  Medication Sig Dispense Refill  . aspirin 81 MG tablet Take 81 mg by mouth daily.      [name] atenolol-chlorthalidone (TENORETIC) 100-25 MG tablet Half tab by mouth twice a day. 30 tablet 0  . FISH OIL-KRILL OIL PO Take 1,000 mg by mouth.    . Omega-3 Fatty Acids (SUPER TWIN  EPA/DHA) 1250 MG CAPS Take 1 capsule by mouth daily.      . potassium chloride SA (KLOR-CON M20) 20 MEQ tablet take 1 tablet by mouth once daily 90 tablet 1   No current facility-administered medications on file prior to visit.    Allergies  Allergen Reactions  . Atorvastatin     cramping      Review of Systems:  Constitutional: No recent illness  HEENT: No  headache, no vision change  Cardiac: No  chest pain, No  pressure, No palpitations  Respiratory:  No  shortness of breath. No  Cough  Gastrointestinal: No  abdominal pain  Musculoskeletal: No new myalgia/arthralgia  Skin: No  Rash  Neurologic: No  weakness, No  Dizziness   Exam:  BP (!) 150/95   Pulse 66   Ht 6' (1.829 m)   Wt 233 lb (105.7 kg)   BMI 31.60 kg/m   Constitutional: VS see above. General Appearance: alert, well-developed, well-nourished, NAD  Eyes: Normal lids and conjunctive, non-icteric sclera  Ears, Nose, Mouth, Throat: MMM, Normal external inspection ears/nares/mouth/lips/gums.  Neck: No masses, trachea midline. No thyromegaly/mass  Respiratory: Normal respiratory effort. no wheeze, no rhonchi, no rales  Cardiovascular: S1/S2 normal, no murmur, no rub/gallop auscultated. RRR.   Musculoskeletal: Gait normal. Symmetric and independent movement of all extremities  Neurological: Normal balance/coordination. No tremor.  Skin: warm, dry, intact.   Psychiatric: Normal judgment/insight. Normal mood and affect. Oriented x3.      ASSESSMENT/PLAN: Increase  chlorthalidone, wrote for atenolol 50-25 to take twice a day rather than 100-25 to take one half twice a day.   Essential hypertension - Plan: COMPLETE METABOLIC PANEL WITH GFR, Lipid panel, TSH, CBC with Differential/Platelet  Hypokalemia - Plan: potassium chloride SA (KLOR-CON M20) 20 MEQ tablet, COMPLETE METABOLIC PANEL WITH GFR  Hyperlipidemia, unspecified hyperlipidemia type    Patient Instructions  If Rx still too expensive,  call us and I will prescribe alternative regimen     Visit summary with medication list and pertinent instructions was printed for patient to review. All questions at time of visit were answered - patient instructed to contact office with any additional concerns. ER/RTC precautions were reviewed with the patient. Follow-up plan: Return in about 4 weeks (around 08/20/2016) for ANNUAL PHYSICAL and BP recheck on new medication .   Note: Total time spent 25 minutes, greater than 50% of the visit was spent face-to-face counseling and coordinating care for the following: Diagnoses of Essential hypertension and Hypokalemia were pertinent to this visit.[name]

## 2016-07-23 NOTE — Addendum Note (Signed)
Addended by: Doree Albee on: 07/23/2016 05:19 PM   Modules accepted: Orders

## 2016-07-23 NOTE — Addendum Note (Signed)
Addended by: Doree Albee on: 07/23/2016 05:21 PM   Modules accepted: Orders

## 2016-07-24 ENCOUNTER — Other Ambulatory Visit: Payer: Self-pay

## 2016-07-24 ENCOUNTER — Other Ambulatory Visit: Payer: Self-pay | Admitting: Osteopathic Medicine

## 2016-07-24 DIAGNOSIS — E876 Hypokalemia: Secondary | ICD-10-CM | POA: Diagnosis not present

## 2016-07-24 DIAGNOSIS — I10 Essential (primary) hypertension: Secondary | ICD-10-CM | POA: Diagnosis not present

## 2016-07-24 LAB — COMPLETE METABOLIC PANEL WITH GFR
ALBUMIN: 4.3 g/dL (ref 3.6–5.1)
ALK PHOS: 62 U/L (ref 40–115)
ALT: 38 U/L (ref 9–46)
AST: 30 U/L (ref 10–35)
BILIRUBIN TOTAL: 0.7 mg/dL (ref 0.2–1.2)
BUN: 30 mg/dL — AB (ref 7–25)
CALCIUM: 9.4 mg/dL (ref 8.6–10.3)
CO2: 28 mmol/L (ref 20–31)
CREATININE: 1.11 mg/dL (ref 0.70–1.18)
Chloride: 100 mmol/L (ref 98–110)
GFR, EST AFRICAN AMERICAN: 77 mL/min (ref >=60)
GFR, Est Non African American: 67 mL/min (ref >=60)
Glucose, Bld: 91 mg/dL (ref 65–99)
Potassium: 3.6 mmol/L (ref 3.5–5.3)
Sodium: 140 mmol/L (ref 135–146)
TOTAL PROTEIN: 6.8 g/dL (ref 6.1–8.1)

## 2016-07-24 LAB — CBC WITH DIFFERENTIAL/PLATELET

## 2016-07-24 LAB — LIPID PANEL
Cholesterol: 196 mg/dL (ref 125–200)
HDL: 46 mg/dL (ref >=40)
LDL CALC: 127 mg/dL (ref <130)
Total CHOL/HDL Ratio: 4.3 Ratio (ref <=5.0)
Triglycerides: 113 mg/dL (ref <150)
VLDL: 23 mg/dL (ref <30)

## 2016-07-24 LAB — TSH: TSH: 1.78 m[IU]/L (ref 0.40–4.50)

## 2016-07-25 LAB — CBC WITH DIFFERENTIAL/PLATELET
BASOS PCT: 0 %
Basophils Absolute: 0 cells/uL (ref 0–200)
EOS PCT: 3 %
Eosinophils Absolute: 204 cells/uL (ref 15–500)
HCT: 46.3 % (ref 38.5–50.0)
Hemoglobin: 16.4 g/dL (ref 13.2–17.1)
LYMPHS PCT: 36 %
Lymphs Abs: 2448 cells/uL (ref 850–3900)
MCH: 32.5 pg (ref 27.0–33.0)
MCHC: 35.4 g/dL (ref 32.0–36.0)
MCV: 91.9 fL (ref 80.0–100.0)
MONOS PCT: 9 %
MPV: 9.4 fL (ref 7.5–12.5)
Monocytes Absolute: 612 cells/uL (ref 200–950)
NEUTROS PCT: 52 %
Neutro Abs: 3536 cells/uL (ref 1500–7800)
PLATELETS: 244 10*3/uL (ref 140–400)
RBC: 5.04 MIL/uL (ref 4.20–5.80)
RDW: 13.2 % (ref 11.0–15.0)
WBC: 6.8 10*3/uL (ref 3.8–10.8)

## 2016-08-20 ENCOUNTER — Encounter: Payer: Self-pay | Admitting: Osteopathic Medicine

## 2016-08-20 ENCOUNTER — Ambulatory Visit (INDEPENDENT_AMBULATORY_CARE_PROVIDER_SITE_OTHER): Payer: Medicare Other | Admitting: Osteopathic Medicine

## 2016-08-20 VITALS — BP 135/85 | HR 66 | Ht 72.0 in | Wt 234.0 lb

## 2016-08-20 DIAGNOSIS — Z0000 Encounter for general adult medical examination without abnormal findings: Secondary | ICD-10-CM

## 2016-08-20 DIAGNOSIS — I10 Essential (primary) hypertension: Secondary | ICD-10-CM

## 2016-08-20 MED ORDER — ATENOLOL-CHLORTHALIDONE 50-25 MG PO TABS: 1.0000 | ORAL_TABLET | Freq: Two times a day (BID) | ORAL | 3 refills | Status: DC

## 2016-08-20 NOTE — Progress Notes (Signed)
HPI: Matthew Hull is a 70 y.o. male  who presents to [facility] today, 08/20/16,  for chief complaint of:  Chief Complaint  Patient presents with  . Annual Exam    Patient presents for annual physical/Medicare wellness exam and recheck blood pressure. No complaints today.   Past medical, surgical, social and family history reviewed: Patient Active Problem List   Diagnosis Date Noted  . Hyperlipidemia 10/11/2014  . Essential hypertension 10/11/2014   Past Surgical History:  Procedure Laterality Date  . Curettage osteomyelitis focus; right middle finger    . I&D of skin and subcutaneous tissue, bone and nonviable, necrotic tissure, right middle finger     Social History  Substance Use Topics  . Smoking status: Never Smoker  . Smokeless tobacco: Never Used  . Alcohol use Yes   Family History  Problem Relation Age of Onset  . Throat cancer Father     throat/neck  . Other Mother     pacemaker     Current medication list and allergy/intolerance information reviewed:   Current Outpatient Prescriptions  Medication Sig Dispense Refill  . aspirin 81 MG tablet Take 81 mg by mouth daily.      [name] atenolol-chlorthalidone (TENORETIC) 50-25 MG tablet Take 1 tablet by mouth 2 (two) times daily. 60 tablet 3  . FISH OIL-KRILL OIL PO Take 1,000 mg by mouth.    . Omega-3 Fatty Acids (SUPER TWIN EPA/DHA) 1250 MG CAPS Take 1 capsule by mouth daily.      . potassium chloride SA (KLOR-CON M20) 20 MEQ tablet take 1 tablet by mouth once daily 90 tablet 1   No current facility-administered medications for this visit.    Allergies  Allergen Reactions  . Atorvastatin     cramping      Review of Systems: Review of Systems  Constitutional: Negative.   HENT: Negative.   Eyes: Negative.   Respiratory: Negative.   Cardiovascular: Negative.   Gastrointestinal: Negative.   Genitourinary: Negative.   Musculoskeletal: Negative.   Skin: Negative.    Neurological: Negative.   Endo/Heme/Allergies: Negative.   Psychiatric/Behavioral: Negative.     Are there smokers in your home (other than you)?  No  Risk Factors Current exercise habits: Home exercise routine includes calisthenics and walking.  Dietary issues discussed: no concerns - pt likes a variety of foods   Cardiac risk factors: advanced age (older than 53 for men, 74 for women), dyslipidemia and male gender.  Depression Screen (Note: if answer to either of the following is "Yes", a more complete depression screening is indicated)   Q1: Over the past two weeks, have you felt down, depressed or hopeless? No  Q2: Over the past two weeks, have you felt little interest or pleasure in doing things? No  Have you lost interest or pleasure in daily life? No  Do you often feel hopeless? No  Do you cry easily over simple problems? No  Activities of Daily Living In your present state of health, do you have any difficulty performing the following activities?:  Driving? No Managing money?  no Feeding yourself? No Getting from bed to  chair? No Climbing a flight of stairs? No Preparing food and eating?: No Bathing or showering? No Getting dressed: No Getting to the toilet? No Using the toilet:No Moving around from place to place: No In the past year have you fallen or had a near fall?:yes - tripped while carrying heavy object   Are you sexually  active?  Yes  Do you have more than one partner?  No  Hearing Difficulties: No Do you often ask people to speak up or repeat themselves? No Do you experience ringing or noises in your ears? No Do you have difficulty understanding soft or whispered voices? No   Do you feel that you have a problem with memory? No  Do you often misplace items? No  Do you feel safe at home?  Yes  Cognitive Testing  Alert? Yes  Normal Appearance?Yes  Oriented to person? Yes  Place? Yes   Time? Yes  Recall of three objects?  Yes  Can perform simple  calculations? Yes  Displays appropriate judgment?Yes  Can read the correct time from a watch face?Yes   Advanced Directives have been discussed with the patient? Yes  Exam:  BP (!) 144/85   Pulse 66   Ht 6' (1.829 m)   Wt 234 lb (106.1 kg)   BMI 31.74 kg/m   Vision by Snellen chart: right eye:see nurse notes, left eye:see nurse notes  Constitutional: VS see above. General Appearance: alert, well-developed, well-nourished, NAD  Ears, Nose, Mouth, Throat: MMM  Neck: No masses, trachea midline.   Respiratory: Normal respiratory effort. no wheeze, no rhonchi, no rales  Cardiovascular:No lower extremity edema.   Musculoskeletal: Gait normal. No clubbing/cyanosis of digits.   Neurological: Normal balance/coordination. No tremor.   Skin: warm, dry, intact. No rash/ulcer.   Psychiatric: Normal judgment/insight. Normal mood and affect. Oriented x3.     ASSESSMENT/PLAN:   Encounter for Medicare annual wellness exam  Annual physical exam  Essential hypertension - Plan: atenolol-chlorthalidone (TENORETIC) 50-25 MG tablet   Nutrition referral needed: No  MALE PREVENTIVE CARE  updated 08/20/16  ANNUAL SCREENING/COUNSELING  Any changes to health in the past year? no  Diet/Exercise - HEALTHY HABITS DISCUSSED TO DECREASE CV RISK History  Smoking Status  . Never Smoker  Smokeless Tobacco  . Never Used   History  Alcohol Use  . Yes   Depression screen PHQ 2/9 07/23/2016  Decreased Interest 0  Down, Depressed, Hopeless 0  PHQ - 2 Score 0    INFECTIOUS DISEASE SCREENING  HIV - does not need  GC/CT - does not need  HepC - declined  TB - does not need  CANCER SCREENING  Lung - USPSTF: 55-80yo w/ 30 py hx unless quit w/in 63yr - does not need  Colon - needs - consider Cologuard  Prostate - does not need  OTHER DISEASE SCREENING  Lipid - does not need  DM2 - does not need  AAA - 65-75yo ever smoked: does not need  Osteoporosis - men 70yo+ - needs  - will consider  Health Maintenance  Topic Date Due  . Hepatitis C Screening  05-25-1946  . COLONOSCOPY  05/26/1996  . ZOSTAVAX  05/26/2006  . TETANUS/TDAP  09/17/2016 (Originally 05/26/1965)  . INFLUENZA VACCINE  07/23/2017 (Originally 05/01/2016)  . PNA vac Low Risk Adult (2 of 2 - PPSV23) 03/20/2017    ADULT VACCINATION  Influenza - annual vaccine recommended  Td - booster every 10 years - [facility] few years ago   Zoster - option at 35, yes at 45+ - declines   PCV13 - already has  PPSV23 - was not indicated Immunization History  Administered Date(s) Administered  . Influenza-Unspecified 07/01/2014  . Pneumococcal Conjugate-13 03/20/2016   OTHER  Fall - exercise and Vit D age 52+ - does not need  Consider ASA - age 2-59 - does not  need  Advanced Directives -  Discussed    During the course of the visit the patient was educated and counseled about appropriate screening and preventive services including:    Pneumococcal vaccine   Influenza vaccine  Td vaccine  Prostate cancer screening  Colorectal cancer screening  Diabetes screening  Nutrition counseling   Advanced directives: has NO advanced directive  - add't info requested. Referral to SW: not applicable   Patient Instructions (the written plan) was given to the patient.  Medicare Attestation I have personally reviewed: The patient's medical and social history Their use of alcohol, tobacco or illicit drugs Their current medications and supplements The patient's functional ability including ADLs,fall risks, home safety risks, cognitive, and hearing and visual impairment Diet and physical activities Evidence for depression or mood disorders  The patient's weight, height, BMI, and visual acuity have been recorded in the chart.  I have made referrals, counseling, and provided education to the patient based on review of the above and I have provided the patient with a written personalized care plan for  preventive services.     Emeterio Reeve, DO   08/22/2016   Visit summary with medication list and pertinent instructions was printed for patient to review. Patient was given additional information on advance directive, shingles vaccine, cologuard colon cancer screening. All questions at time of visit were answered - patient instructed to contact office with any additional concerns. ER/RTC precautions were reviewed with the patient. Follow-up plan: Return in about 6 months (around 02/17/2017) for RECHECK BLOOD PRESSURE, sooner if needed.

## 2017-02-18 ENCOUNTER — Encounter: Payer: Self-pay | Admitting: Osteopathic Medicine

## 2017-02-18 ENCOUNTER — Ambulatory Visit (INDEPENDENT_AMBULATORY_CARE_PROVIDER_SITE_OTHER): Payer: Medicare Other | Admitting: Osteopathic Medicine

## 2017-02-18 VITALS — BP 145/100 | HR 73 | Ht 72.0 in | Wt 240.0 lb

## 2017-02-18 DIAGNOSIS — I10 Essential (primary) hypertension: Secondary | ICD-10-CM

## 2017-02-18 DIAGNOSIS — R7989 Other specified abnormal findings of blood chemistry: Secondary | ICD-10-CM

## 2017-02-18 DIAGNOSIS — R635 Abnormal weight gain: Secondary | ICD-10-CM | POA: Diagnosis not present

## 2017-02-18 NOTE — Patient Instructions (Addendum)
Goal BP: less than 140/90 Plan to come back for nurse visit to verify your home BP monitor - further follow-up with me will depend on that visit.

## 2017-02-18 NOTE — Progress Notes (Signed)
HPI: Matthew Hull is a 71 y.o. male  who presents to [facility] today, 02/18/17,  for chief complaint of:  Chief Complaint  Patient presents with  . Follow-up    blood pressure    Hypertension: Complaint with antihypertensive medications as noted below, patient is not routinely in the habit of taking potassium medication. No headaches, chest pain, palpitations. Reports recently got home but pressure monitor and numbers on there have been typically in the 130s over 80s.    Past medical history, surgical history, social history and family history reviewed.  Patient Active Problem List   Diagnosis Date Noted  . Hyperlipidemia 10/11/2014  . Essential hypertension 10/11/2014    Current medication list and allergy/intolerance information reviewed.   Current Outpatient Prescriptions on File Prior to Visit  Medication Sig Dispense Refill  . aspirin 81 MG tablet Take 81 mg by mouth daily.      [name] atenolol-chlorthalidone (TENORETIC) 50-25 MG tablet Take 1 tablet by mouth 2 (two) times daily. 180 tablet 3  . FISH OIL-KRILL OIL PO Take 1,000 mg by mouth.    . Omega-3 Fatty Acids (SUPER TWIN EPA/DHA) 1250 MG CAPS Take 1 capsule by mouth daily.      . potassium chloride SA (KLOR-CON M20) 20 MEQ tablet take 1 tablet by mouth once daily 90 tablet 1   No current facility-administered medications on file prior to visit.    Allergies  Allergen Reactions  . Atorvastatin     cramping      Review of Systems:  Constitutional: No recent illness  HEENT: No  headache, no vision change  Cardiac: No  chest pain, No  pressure, No palpitations  Respiratory:  No  shortness of breath.  Neurologic: No  weakness, No  Dizziness   Exam:  BP (!) 145/100   Pulse 73   Ht 6' (1.829 m)   Wt 240 lb (108.9 kg)   BMI 32.55 kg/m   Constitutional: VS see above. General Appearance: alert, well-developed, well-nourished, NAD  Eyes: Normal lids and conjunctive,  non-icteric sclera  Ears, Nose, Mouth, Throat: MMM, Normal external inspection ears/nares/mouth/lips/gums.  Neck: No masses, trachea midline.   Respiratory: Normal respiratory effort. no wheeze, no rhonchi, no rales  Cardiovascular: S1/S2 normal, no murmur, no rub/gallop auscultated. RRR.   Musculoskeletal: Gait normal. Symmetric and independent movement of all extremities  Neurological: Normal balance/coordination. No tremor.  Skin: warm, dry, intact.   Psychiatric: Normal judgment/insight. Normal mood and affect. Oriented x3.      ASSESSMENT/PLAN:   Essential hypertension - Plan: BASIC METABOLIC PANEL WITH GFR  Weight gain - Work on lifestyle modifications/weight loss as antihypertensive measure as well    Patient Instructions  Goal BP: less than 140/90 Plan to come back for nurse visit to verify your home BP monitor - further follow-up with me will depend on that visit.    Follow-up plan: Return for nurse visit - BP monitor.  Visit summary with medication list and pertinent instructions was printed for patient to review, alert [place] if any changes needed. All questions at time of visit were answered - patient instructed to contact office with any additional concerns. ER/RTC precautions were reviewed with the patient and understanding verbalized.

## 2017-02-19 ENCOUNTER — Ambulatory Visit (INDEPENDENT_AMBULATORY_CARE_PROVIDER_SITE_OTHER): Payer: Medicare Other | Admitting: Osteopathic Medicine

## 2017-02-19 VITALS — BP 135/67 | HR 69

## 2017-02-19 DIAGNOSIS — I10 Essential (primary) hypertension: Secondary | ICD-10-CM

## 2017-02-19 LAB — BASIC METABOLIC PANEL WITH GFR
BUN: 23 mg/dL (ref 7–25)
CALCIUM: 9.7 mg/dL (ref 8.6–10.3)
CHLORIDE: 103 mmol/L (ref 98–110)
CO2: 26 mmol/L (ref 20–31)
CREATININE: 1.24 mg/dL — AB (ref 0.70–1.18)
GFR, Est African American: 68 mL/min (ref >=60)
GFR, Est Non African American: 59 mL/min — ABNORMAL LOW (ref >=60)
Glucose, Bld: 82 mg/dL (ref 65–99)
Potassium: 3.6 mmol/L (ref 3.5–5.3)
SODIUM: 140 mmol/L (ref 135–146)

## 2017-02-19 NOTE — Addendum Note (Signed)
Addended by: Maryla Morrow on: 02/19/2017 12:11 PM   Modules accepted: Orders

## 2017-02-19 NOTE — Progress Notes (Signed)
Pt came into clinic today for BP check and to check him home machine against ours. Pt's BP was at goal in office today. Our reading: 135/67 (69), home machine: 121/75 (68). Pt's home machine is a wrist cuff, advised him to purchase a new machine that has an arm cuff. Verbalized understanding. Will route to PCP for review, advised Pt if there were going to be any changes we would contact him, no further questions at this time.

## 2017-02-19 NOTE — Progress Notes (Signed)
Blood pressure looks good. I had some concerns about slightly decreased kidney function, would like him to get this rechecked in the next month. Matthew Hull may have already called him about this but if we could just call the patient to make sure that we are on the same page with labs, and he can follow-up for routine BP recheck in 4-6 months

## 2017-08-14 DIAGNOSIS — H524 Presbyopia: Secondary | ICD-10-CM | POA: Diagnosis not present

## 2017-08-14 DIAGNOSIS — H35372 Puckering of macula, left eye: Secondary | ICD-10-CM | POA: Diagnosis not present

## 2017-08-14 DIAGNOSIS — H52223 Regular astigmatism, bilateral: Secondary | ICD-10-CM | POA: Diagnosis not present

## 2017-08-14 DIAGNOSIS — H5213 Myopia, bilateral: Secondary | ICD-10-CM | POA: Diagnosis not present

## 2017-08-14 DIAGNOSIS — H25813 Combined forms of age-related cataract, bilateral: Secondary | ICD-10-CM | POA: Diagnosis not present

## 2017-08-27 DIAGNOSIS — H43813 Vitreous degeneration, bilateral: Secondary | ICD-10-CM | POA: Diagnosis not present

## 2017-08-27 DIAGNOSIS — H35372 Puckering of macula, left eye: Secondary | ICD-10-CM | POA: Diagnosis not present

## 2017-08-27 DIAGNOSIS — H33321 Round hole, right eye: Secondary | ICD-10-CM | POA: Diagnosis not present

## 2017-08-28 DIAGNOSIS — Z23 Encounter for immunization: Secondary | ICD-10-CM | POA: Diagnosis not present

## 2017-09-05 ENCOUNTER — Other Ambulatory Visit: Payer: Self-pay | Admitting: Osteopathic Medicine

## 2017-09-05 ENCOUNTER — Other Ambulatory Visit: Payer: Self-pay

## 2017-09-05 DIAGNOSIS — I10 Essential (primary) hypertension: Secondary | ICD-10-CM

## 2017-09-10 ENCOUNTER — Encounter: Payer: Self-pay | Admitting: Osteopathic Medicine

## 2017-09-10 DIAGNOSIS — H359 Unspecified retinal disorder: Secondary | ICD-10-CM | POA: Insufficient documentation

## 2017-09-18 DIAGNOSIS — E669 Obesity, unspecified: Secondary | ICD-10-CM | POA: Diagnosis not present

## 2017-09-18 DIAGNOSIS — I10 Essential (primary) hypertension: Secondary | ICD-10-CM | POA: Diagnosis not present

## 2017-09-18 DIAGNOSIS — H35372 Puckering of macula, left eye: Secondary | ICD-10-CM | POA: Diagnosis not present

## 2017-09-18 DIAGNOSIS — Z7982 Long term (current) use of aspirin: Secondary | ICD-10-CM | POA: Diagnosis not present

## 2017-09-18 DIAGNOSIS — Z79899 Other long term (current) drug therapy: Secondary | ICD-10-CM | POA: Diagnosis not present

## 2017-09-18 DIAGNOSIS — Z6831 Body mass index (BMI) 31.0-31.9, adult: Secondary | ICD-10-CM | POA: Diagnosis not present

## 2017-09-30 DIAGNOSIS — H43813 Vitreous degeneration, bilateral: Secondary | ICD-10-CM | POA: Diagnosis not present

## 2017-10-22 DIAGNOSIS — H43813 Vitreous degeneration, bilateral: Secondary | ICD-10-CM | POA: Diagnosis not present

## 2017-10-29 DIAGNOSIS — H2513 Age-related nuclear cataract, bilateral: Secondary | ICD-10-CM | POA: Diagnosis not present

## 2017-10-29 DIAGNOSIS — H35372 Puckering of macula, left eye: Secondary | ICD-10-CM | POA: Diagnosis not present

## 2017-11-01 DIAGNOSIS — H2510 Age-related nuclear cataract, unspecified eye: Secondary | ICD-10-CM | POA: Insufficient documentation

## 2017-11-04 DIAGNOSIS — Z79899 Other long term (current) drug therapy: Secondary | ICD-10-CM | POA: Diagnosis not present

## 2017-11-04 DIAGNOSIS — Z7982 Long term (current) use of aspirin: Secondary | ICD-10-CM | POA: Diagnosis not present

## 2017-11-04 DIAGNOSIS — Z961 Presence of intraocular lens: Secondary | ICD-10-CM | POA: Diagnosis not present

## 2017-11-04 DIAGNOSIS — I10 Essential (primary) hypertension: Secondary | ICD-10-CM | POA: Diagnosis not present

## 2017-11-04 DIAGNOSIS — H25012 Cortical age-related cataract, left eye: Secondary | ICD-10-CM | POA: Diagnosis not present

## 2017-11-04 DIAGNOSIS — H25011 Cortical age-related cataract, right eye: Secondary | ICD-10-CM | POA: Diagnosis not present

## 2017-11-04 DIAGNOSIS — H2512 Age-related nuclear cataract, left eye: Secondary | ICD-10-CM | POA: Diagnosis not present

## 2017-11-04 DIAGNOSIS — E669 Obesity, unspecified: Secondary | ICD-10-CM | POA: Diagnosis not present

## 2017-11-04 DIAGNOSIS — H2513 Age-related nuclear cataract, bilateral: Secondary | ICD-10-CM | POA: Diagnosis not present

## 2017-11-04 DIAGNOSIS — H35372 Puckering of macula, left eye: Secondary | ICD-10-CM | POA: Diagnosis not present

## 2017-11-25 DIAGNOSIS — H2511 Age-related nuclear cataract, right eye: Secondary | ICD-10-CM | POA: Diagnosis not present

## 2017-11-25 DIAGNOSIS — Z961 Presence of intraocular lens: Secondary | ICD-10-CM | POA: Diagnosis not present

## 2017-11-25 DIAGNOSIS — H25011 Cortical age-related cataract, right eye: Secondary | ICD-10-CM | POA: Diagnosis not present

## 2017-11-25 DIAGNOSIS — H35372 Puckering of macula, left eye: Secondary | ICD-10-CM | POA: Diagnosis not present

## 2017-11-25 DIAGNOSIS — E669 Obesity, unspecified: Secondary | ICD-10-CM | POA: Diagnosis not present

## 2017-11-25 DIAGNOSIS — I10 Essential (primary) hypertension: Secondary | ICD-10-CM | POA: Diagnosis not present

## 2018-01-24 DIAGNOSIS — H43813 Vitreous degeneration, bilateral: Secondary | ICD-10-CM | POA: Diagnosis not present

## 2018-01-24 DIAGNOSIS — H35423 Microcystoid degeneration of retina, bilateral: Secondary | ICD-10-CM | POA: Diagnosis not present

## 2018-01-24 DIAGNOSIS — H35352 Cystoid macular degeneration, left eye: Secondary | ICD-10-CM | POA: Diagnosis not present

## 2018-01-24 DIAGNOSIS — H35362 Drusen (degenerative) of macula, left eye: Secondary | ICD-10-CM | POA: Diagnosis not present

## 2018-02-26 ENCOUNTER — Encounter: Payer: Self-pay | Admitting: Osteopathic Medicine

## 2018-02-26 ENCOUNTER — Telehealth: Payer: Self-pay | Admitting: Osteopathic Medicine

## 2018-02-26 ENCOUNTER — Ambulatory Visit (INDEPENDENT_AMBULATORY_CARE_PROVIDER_SITE_OTHER): Payer: Medicare Other | Admitting: Osteopathic Medicine

## 2018-02-26 VITALS — BP 142/81 | HR 78 | Temp 98.1°F | Wt 240.0 lb

## 2018-02-26 DIAGNOSIS — L989 Disorder of the skin and subcutaneous tissue, unspecified: Secondary | ICD-10-CM

## 2018-02-26 DIAGNOSIS — Z23 Encounter for immunization: Secondary | ICD-10-CM

## 2018-02-26 DIAGNOSIS — Z0000 Encounter for general adult medical examination without abnormal findings: Secondary | ICD-10-CM | POA: Diagnosis not present

## 2018-02-26 DIAGNOSIS — Z1211 Encounter for screening for malignant neoplasm of colon: Secondary | ICD-10-CM | POA: Diagnosis not present

## 2018-02-26 DIAGNOSIS — I10 Essential (primary) hypertension: Secondary | ICD-10-CM | POA: Diagnosis not present

## 2018-02-26 MED ORDER — ATENOLOL-CHLORTHALIDONE 50-25 MG PO TABS: 0.5000 | ORAL_TABLET | Freq: Two times a day (BID) | ORAL | 3 refills | Status: DC

## 2018-02-26 NOTE — Patient Instructions (Addendum)
Plan:  You're on the list for shingles shot - we will call you when the vaccine is back in stock  Recommend flu vaccine every fall  You're done with pneumonia vaccines!   You should get a call about setting up a colonoscopy   You should get a call about setting up a consult with a dermatologist for second opinion on the skin lesion and whether it needs a biopsy   Recommend review and complete/notarize the advance directive, we can keep a copy on file if desired  Fasting labs due - please get blood drawn at your convenience  As long as labs are okay, let's plan to recheck blood pressure in 6 months or so, since you were a little borderline today

## 2018-02-26 NOTE — Progress Notes (Signed)
HPI: Matthew Hull is a 72 y.o. male  who presents to [place] today, 02/26/18,  for chief complaint of:  Medicare annual visit    Patient presents for annual physical/Medicare wellness exam and recheck blood pressure. No complaints today.  Iona Hansen - L eye issue w/ retinal abnormality, s/p surgical correction as well as cataract surgery   Past medical, surgical, social and family history reviewed: Patient Active Problem List   Diagnosis Date Noted  . 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 Hyperlipidemia 10/11/2014  . Essential hypertension 10/11/2014   Past Surgical History:  Procedure Laterality Date  . Curettage osteomyelitis focus; right middle finger    . I&D of skin and subcutaneous tissue, bone and nonviable, necrotic tissure, right middle finger     Social History   Tobacco Use  . Smoking status: Never Smoker  . Smokeless tobacco: Never Used  Substance Use Topics  . Alcohol use: Yes   Family History  Problem Relation Age of Onset  . Throat cancer Father        throat/neck  . Other Mother        pacemaker     Current medication list and allergy/intolerance information reviewed:   Current Outpatient Medications  Medication Sig Dispense Refill  . aspirin 81 MG tablet Take 81 mg by mouth daily.      [name] atenolol-chlorthalidone (TENORETIC) 50-25 MG tablet TAKE 1 TABLET BY MOUTH 2 (TWO) TIMES DAILY. 180 tablet 3  . FISH OIL-KRILL OIL PO Take 1,000 mg by mouth.    . Omega-3 Fatty Acids (SUPER TWIN EPA/DHA) 1250 MG CAPS Take 1 capsule by mouth daily.      . potassium chloride SA (KLOR-CON M20) 20 MEQ tablet take 1 tablet by mouth once daily 90 tablet 1   No current facility-administered medications for this visit.    Allergies  Allergen Reactions  . Atorvastatin     cramping      Review of Systems: Review of Systems  Constitutional: Negative.   HENT: Negative.   Eyes: Negative.   Respiratory: Negative.   Cardiovascular: Negative.    Gastrointestinal: Negative.   Genitourinary: Negative.   Musculoskeletal: Negative.   Skin: Negative.   Neurological: Negative.   Endo/Heme/Allergies: Negative.   Psychiatric/Behavioral: Negative.     Are there smokers in your home (other than you)?  No  Risk Factors Current exercise habits: Home exercise routine includes calisthenics and walking.  Dietary issues discussed: no concerns - pt likes a variety of foods   Cardiac risk factors: advanced age (older than 17 for men, 42 for women), dyslipidemia and male gender.  Depression Screen (Note: if answer to either of the following is "Yes", a more complete depression screening is indicated)   Q1: Over the past two weeks, have you felt down, depressed or hopeless? No  Q2: Over the past two weeks, have you felt little interest or pleasure in doing things? No  Have you lost interest or pleasure in daily life? No  Do you often feel hopeless? No  Do you cry easily over simple problems? No  Activities of Daily Living In your present state of health, do you have any difficulty performing the following activities?:  Driving? No Managing money?  no Feeding yourself? No Getting from bed to  chair? No Climbing a flight of stairs? No Preparing food and eating?: No Bathing or showering? No Getting dressed: No Getting to the toilet? No Using the toilet:No Moving around from place to  place: No In the past year have you fallen or had a near fall?:no  Hearing Difficulties: No Do you often ask people to speak up or repeat themselves? No Do you experience ringing or noises in your ears? No Do you have difficulty understanding soft or whispered voices? No   Do you feel that you have a problem with memory? No  Do you often misplace items? No  Do you feel safe at home?  Yes  Cognitive Testing  Alert? Yes  Normal Appearance?Yes  Oriented to person? Yes  Place? Yes   Time? Yes  Recall of three objects?  Yes  Can perform simple  calculations? Yes  Displays appropriate judgment?Yes  Can read the correct time from a watch face?Yes   Advanced Directives have been discussed with the patient? Yes  Exam:  BP (!) 144/73 (BP Location: Left Arm, Patient Position: Sitting, Cuff Size: Normal)   Pulse 78   Temp 98.1 F (36.7 C) (Oral)   Wt 240 lb (108.9 kg)   BMI 32.55 kg/m    Constitutional: VS see above. General Appearance: alert, well-developed, well-nourished, NAD  Ears, Nose, Mouth, Throat: MMM  Neck: No masses, trachea midline.   Respiratory: Normal respiratory effort. no wheeze, no rhonchi, no rales  Cardiovascular:No lower extremity edema.   Musculoskeletal: Gait normal. No clubbing/cyanosis of digits.   Neurological: Normal balance/coordination. No tremor.   Skin: warm, dry, intact. No rash/ulcer. Small hemangiomas. Small rough warty growth on face to L of nose, inferior to L eye -  Psychiatric: Normal judgment/insight. Normal mood and affect. Oriented x3.     ASSESSMENT/PLAN:   No diagnosis found.   Nutrition referral needed: No  MALE PREVENTIVE CARE  updated 02/26/18  ANNUAL SCREENING/COUNSELING  Any changes to health in the past year? no  Diet/Exercise - HEALTHY HABITS DISCUSSED TO DECREASE CV RISK Social History   Tobacco Use  Smoking Status Never Smoker  Smokeless Tobacco Never Used   Social History   Substance and Sexual Activity  Alcohol Use Yes   Depression screen PHQ 2/9 07/23/2016  Decreased Interest 0  Down, Depressed, Hopeless 0  PHQ - 2 Score 0    INFECTIOUS DISEASE SCREENING  HIV - does not need  GC/CT - does not need  HepC - declined  TB - does not need  CANCER SCREENING  Lung - does not need  Colon - needs - opts for Cologuard  Prostate - does not need  OTHER DISEASE SCREENING  Lipid - does not need  DM2 - does not need  AAA - 65-75yo ever smoked: does not need  Osteoporosis - men 72yo+ - needs - will consider   Health Maintenance   Topic Date Due  . TETANUS/TDAP  05/26/1965  . PNA vac Low Risk Adult (2 of 2 - PPSV23) 03/20/2017  . COLONOSCOPY  10/01/2018 (Originally 05/26/1996)  . Hepatitis C Screening  10/01/2018 (Originally [birth year])  . INFLUENZA VACCINE  05/01/2018    ADULT VACCINATION  Influenza - annual vaccine recommended  Td - booster every 10 years - [facility] few years ago, he declines today, we do not ave records   Zoster - option at 65, yes at 18+ - put on list  PCV13 - already has  PPSV23 - will get this today  Immunization History  Administered Date(s) Administered  . Influenza, High Dose Seasonal PF 08/28/2017  . Influenza-Unspecified 07/01/2014  . Pneumococcal Conjugate-13 03/20/2016  . Pneumococcal Polysaccharide-23 02/26/2018   OTHER  Fall - exercise and Vit  D age 54+ - does not need  Consider ASA - age 12-59 - does not need  Advanced Directives -  Discussed    During the course of the visit the patient was educated and counseled about appropriate screening and preventive services including:    Pneumococcal vaccine   Influenza vaccine  Td vaccine  Colorectal cancer screening  Diabetes screening  Nutrition counseling   Advanced directives: has NO advanced directive  - add't info requested. Referral to SW: not applicable    Patient Instructions (the written plan) was given to the patient. Patient Instructions  Plan:  You're on the list for shingles shot - we will call you when the vaccine is back in stock  Recommend flu vaccine every fall  You're done with pneumonia vaccines!   You should get a call about setting up a colonoscopy   You should get a call about setting up a consult with a dermatologist for second opinion on the skin lesion and whether it needs a biopsy   Recommend review and complete/notarize the advance directive, we can keep a copy on file if desired  Fasting labs due - please get blood drawn at your convenience  As long as labs are okay,  let's plan to recheck blood pressure in 6 months or so, since you were a little borderline today      Medicare Attestation I have personally reviewed: The patient's medical and social history Their use of alcohol, tobacco or illicit drugs Their current medications and supplements The patient's functional ability including ADLs,fall risks, home safety risks, cognitive, and hearing and visual impairment Diet and physical activities Evidence for depression or mood disorders  The patient's weight, height, BMI, and visual acuity have been recorded in the chart.  I have made referrals, counseling, and provided education to the patient based on review of the above and I have provided the patient with a written personalized care plan for preventive services.     Emeterio Reeve, DO   02/26/2018   Visit summary with medication list and pertinent instructions was printed for patient to review. Patient was given additional information on advance directive, shingles vaccine, cologuard colon cancer screening. All questions at time of visit were answered - patient instructed to contact office with any additional concerns. ER/RTC precautions were reviewed with the patient. Follow-up plan: No follow-ups on file.

## 2018-02-26 NOTE — Telephone Encounter (Signed)
-----   Message from Emeterio Reeve, DO sent at 02/26/2018  2:36 PM EDT ----- Regarding: shingles Shingles list! Thanks!

## 2018-02-26 NOTE — Telephone Encounter (Signed)
Added

## 2018-02-28 DIAGNOSIS — R7301 Impaired fasting glucose: Secondary | ICD-10-CM | POA: Diagnosis not present

## 2018-02-28 DIAGNOSIS — I10 Essential (primary) hypertension: Secondary | ICD-10-CM | POA: Diagnosis not present

## 2018-03-03 LAB — COMPLETE METABOLIC PANEL WITH GFR
AG RATIO: 1.8 (calc) (ref 1.0–2.5)
ALBUMIN MSPROF: 4.2 g/dL (ref 3.6–5.1)
ALKALINE PHOSPHATASE (APISO): 59 U/L (ref 40–115)
ALT: 37 U/L (ref 9–46)
AST: 29 U/L (ref 10–35)
BUN / CREAT RATIO: 25 (calc) — AB (ref 6–22)
BUN: 26 mg/dL — ABNORMAL HIGH (ref 7–25)
CO2: 30 mmol/L (ref 20–32)
CREATININE: 1.06 mg/dL (ref 0.70–1.18)
Calcium: 9.9 mg/dL (ref 8.6–10.3)
Chloride: 98 mmol/L (ref 98–110)
GFR, Est African American: 81 mL/min/{1.73_m2} (ref >=60)
GFR, Est Non African American: 70 mL/min/{1.73_m2} (ref >=60)
GLOBULIN: 2.3 g/dL (ref 1.9–3.7)
Glucose, Bld: 118 mg/dL — ABNORMAL HIGH (ref 65–99)
POTASSIUM: 3.7 mmol/L (ref 3.5–5.3)
SODIUM: 140 mmol/L (ref 135–146)
Total Bilirubin: 0.8 mg/dL (ref 0.2–1.2)
Total Protein: 6.5 g/dL (ref 6.1–8.1)

## 2018-03-03 LAB — LIPID PANEL
CHOL/HDL RATIO: 4.5 (calc) (ref <5.0)
Cholesterol: 193 mg/dL (ref <200)
HDL: 43 mg/dL (ref >40)
LDL Cholesterol (Calc): 122 mg/dL (calc) — ABNORMAL HIGH
NON-HDL CHOLESTEROL (CALC): 150 mg/dL — AB (ref <130)
Triglycerides: 159 mg/dL — ABNORMAL HIGH (ref <150)

## 2018-03-03 LAB — CBC
HEMATOCRIT: 46.8 % (ref 38.5–50.0)
HEMOGLOBIN: 16.7 g/dL (ref 13.2–17.1)
MCH: 32 pg (ref 27.0–33.0)
MCHC: 35.7 g/dL (ref 32.0–36.0)
MCV: 89.7 fL (ref 80.0–100.0)
MPV: 9.8 fL (ref 7.5–12.5)
Platelets: 260 10*3/uL (ref 140–400)
RBC: 5.22 10*6/uL (ref 4.20–5.80)
RDW: 12.5 % (ref 11.0–15.0)
WBC: 7.6 10*3/uL (ref 3.8–10.8)

## 2018-03-03 LAB — HEMOGLOBIN A1C W/OUT EAG: HEMOGLOBIN A1C: 5.9 %{Hb} — AB (ref <5.7)

## 2018-03-04 DIAGNOSIS — H35363 Drusen (degenerative) of macula, bilateral: Secondary | ICD-10-CM | POA: Diagnosis not present

## 2018-03-04 DIAGNOSIS — H35352 Cystoid macular degeneration, left eye: Secondary | ICD-10-CM | POA: Diagnosis not present

## 2018-03-04 DIAGNOSIS — H43811 Vitreous degeneration, right eye: Secondary | ICD-10-CM | POA: Diagnosis not present

## 2018-03-12 DIAGNOSIS — K5730 Diverticulosis of large intestine without perforation or abscess without bleeding: Secondary | ICD-10-CM | POA: Diagnosis not present

## 2018-03-12 DIAGNOSIS — D123 Benign neoplasm of transverse colon: Secondary | ICD-10-CM | POA: Diagnosis not present

## 2018-03-12 DIAGNOSIS — Z1211 Encounter for screening for malignant neoplasm of colon: Secondary | ICD-10-CM | POA: Diagnosis not present

## 2018-03-12 DIAGNOSIS — K648 Other hemorrhoids: Secondary | ICD-10-CM | POA: Diagnosis not present

## 2018-04-08 ENCOUNTER — Telehealth: Payer: Self-pay

## 2018-04-08 NOTE — Telephone Encounter (Signed)
Pt was on wait list for Shingrix Vaccine.    Pt insurance will not cover for this to be given in office.   RX pended to send to pharmacy for vaccine.   Please route back so I can let pt know.    Thanks!

## 2018-04-09 MED ORDER — ZOSTER VAC RECOMB ADJUVANTED 50 MCG/0.5ML IM SUSR: 0.5000 mL | Freq: Once | INTRAMUSCULAR | 1 refills | Status: AC

## 2018-04-09 NOTE — Telephone Encounter (Signed)
Sent to [facility] on file  

## 2018-04-09 NOTE — Telephone Encounter (Signed)
Pt advised that RX has been sent to pharmacy and that he will need to get immunization from there due to ins.

## 2018-05-13 DIAGNOSIS — H35363 Drusen (degenerative) of macula, bilateral: Secondary | ICD-10-CM | POA: Diagnosis not present

## 2018-07-08 DIAGNOSIS — H35363 Drusen (degenerative) of macula, bilateral: Secondary | ICD-10-CM | POA: Diagnosis not present

## 2018-07-08 DIAGNOSIS — H43811 Vitreous degeneration, right eye: Secondary | ICD-10-CM | POA: Diagnosis not present

## 2018-07-08 DIAGNOSIS — H35352 Cystoid macular degeneration, left eye: Secondary | ICD-10-CM | POA: Diagnosis not present

## 2018-08-25 ENCOUNTER — Ambulatory Visit: Payer: Medicare Other | Admitting: Osteopathic Medicine

## 2018-09-01 DIAGNOSIS — Z23 Encounter for immunization: Secondary | ICD-10-CM | POA: Diagnosis not present

## 2018-09-02 ENCOUNTER — Other Ambulatory Visit: Payer: Self-pay | Admitting: Osteopathic Medicine

## 2018-09-02 DIAGNOSIS — I10 Essential (primary) hypertension: Secondary | ICD-10-CM

## 2018-09-09 ENCOUNTER — Encounter: Payer: Self-pay | Admitting: Osteopathic Medicine

## 2018-09-09 ENCOUNTER — Ambulatory Visit: Payer: Medicare Other | Admitting: Osteopathic Medicine

## 2018-09-09 ENCOUNTER — Ambulatory Visit (INDEPENDENT_AMBULATORY_CARE_PROVIDER_SITE_OTHER): Payer: Medicare Other | Admitting: Osteopathic Medicine

## 2018-09-09 VITALS — BP 125/76 | HR 61 | Temp 98.0°F | Wt 239.1 lb

## 2018-09-09 DIAGNOSIS — R7303 Prediabetes: Secondary | ICD-10-CM | POA: Diagnosis not present

## 2018-09-09 DIAGNOSIS — I10 Essential (primary) hypertension: Secondary | ICD-10-CM

## 2018-09-09 LAB — POCT GLYCOSYLATED HEMOGLOBIN (HGB A1C): Hemoglobin A1C: 6.2 % — AB (ref 4.0–5.6)

## 2018-09-09 NOTE — Patient Instructions (Addendum)
Blood pressure looks good today!   Sugars: prediabetic range 5.9 six months ago Up to 6.2 today Let's recheck in 3 months!     Preventing Type 2 Diabetes Mellitus Type 2 diabetes (type 2 diabetes mellitus) is a long-term (chronic) disease that affects blood sugar (glucose) levels. Normally, a hormone called insulin allows glucose to enter cells in the body. The cells use glucose for energy. In type 2 diabetes, one or both of these problems may be present:  The body does not make enough insulin.  The body does not respond properly to insulin that it makes (insulin resistance).  Insulin resistance or lack of insulin causes excess glucose to build up in the blood instead of going into cells. As a result, high blood glucose (hyperglycemia) develops, which can cause many complications. Being overweight or obese and having an inactive (sedentary) lifestyle can increase your risk for diabetes. Type 2 diabetes can be delayed or prevented by making certain nutrition and lifestyle changes. What nutrition changes can be made?  Eat healthy meals and snacks regularly. Keep a healthy snack with you for when you get hungry between meals, such as fruit or a handful of nuts.  Eat lean meats and proteins that are low in saturated fats, such as chicken, fish, egg whites, and beans. Avoid processed meats.  Eat plenty of fruits and vegetables and plenty of grains that have not been processed (whole grains). It is recommended that you eat: ? 1?2 cups of fruit every day. ? 2?3 cups of vegetables every day. ? 6?8 oz of whole grains every day, such as oats, whole wheat, bulgur, brown rice, quinoa, and millet.  Eat low-fat dairy products, such as milk, yogurt, and cheese.  Eat foods that contain healthy fats, such as nuts, avocado, olive oil, and canola oil.  Drink water throughout the day. Avoid drinks that contain added sugar, such as soda or sweet tea.  Follow instructions from your health care provider  about specific eating or drinking restrictions.  Control how much food you eat at a time (portion size). ? Check food labels to find out the serving sizes of foods. ? Use a kitchen scale to weigh amounts of foods.  Saute or steam food instead of frying it. Cook with water or broth instead of oils or butter.  Limit your intake of: ? Salt (sodium). Have no more than 1 tsp (2,400 mg) of sodium a day. If you have heart disease or high blood pressure, have less than ? tsp (1,500 mg) of sodium a day. ? Saturated fat. This is fat that is solid at room temperature, such as butter or fat on meat. What lifestyle changes can be made?  Activity  Do moderate-intensity physical activity for at least 30 minutes on at least 5 days of the week, or as much as told by your health care provider.  Ask your health care provider what activities are safe for you. A mix of physical activities may be best, such as walking, swimming, cycling, and strength training.  Try to add physical activity into your day. For example: ? Park in spots that are farther away than usual, so that you walk more. For example, park in a far corner of the parking lot when you go to the office or the grocery store. ? Take a walk during your lunch break. ? Use stairs instead of elevators or escalators. Weight Loss  Lose weight as directed. Your health care provider can determine how much weight loss is  best for you and can help you lose weight safely.  If you are overweight or obese, you may be instructed to lose at least 5?7 % of your body weight. Alcohol and Tobacco   Limit alcohol intake to no more than 1 drink a day for nonpregnant women and 2 drinks a day for men. One drink equals 12 oz of beer, 5 oz of wine, or 1 oz of hard liquor.  Do not use any tobacco products, such as cigarettes, chewing tobacco, and e-cigarettes. If you need help quitting, ask your health care provider. Work With [place] Provider  Have your  blood glucose tested regularly, as told by your health care provider.  Discuss your risk factors and how you can reduce your risk for diabetes.  Get screening tests as told by your health care provider. You may have screening tests regularly, especially if you have certain risk factors for type 2 diabetes.  Make an appointment with a diet and nutrition specialist (registered dietitian). A registered dietitian can help you make a healthy eating plan and can help you understand portion sizes and food labels. Why are these changes important?  It is possible to prevent or delay type 2 diabetes and related health problems by making lifestyle and nutrition changes.  It can be difficult to recognize signs of type 2 diabetes. The best way to avoid possible damage to your body is to take actions to prevent the disease before you develop symptoms. What can happen if changes are not made?  Your blood glucose levels may keep increasing. Having high blood glucose for a long time is dangerous. Too much glucose in your blood can damage your blood vessels, heart, kidneys, nerves, and eyes.  You may develop prediabetes or type 2 diabetes. Type 2 diabetes can lead to many chronic health problems and complications, such as: ? Heart disease. ? Stroke. ? Blindness. ? Kidney disease. ? Depression. ? Poor circulation in the feet and legs, which could lead to surgical removal (amputation) in severe cases. Where to find support:  Ask your health care provider to recommend a registered dietitian, diabetes educator, or weight loss program.  Look for local or online weight loss groups.  Join a gym, fitness club, or outdoor activity group, such as a walking club. Where to find more information: To learn more about diabetes and diabetes prevention, visit:  American Diabetes Association (ADA): www.diabetes.[employer] of Diabetes and Digestive and Kidney Diseases:  FindSpin.nl  To learn more about healthy eating, visit:  The U.S. Department of Agriculture Scientist, research (physical sciences)), Choose My Plate: http://wiley-williams.com/  Office of Disease Prevention and Health Promotion (ODPHP), Dietary Guidelines: SurferLive.at  Summary  You can reduce your risk for type 2 diabetes by increasing your physical activity, eating healthy foods, and losing weight as directed.  Talk with your health care provider about your risk for type 2 diabetes. Ask about any blood tests or screening tests that you need to have. This information is not intended to replace advice given to you by your health care provider. Make sure you discuss any questions you have with your health care provider. Document Released: 01/09/2016 Document Revised: 02/23/2016 Document Reviewed: 11/08/2015 Elsevier Interactive Patient Education  [employer].

## 2018-09-09 NOTE — Progress Notes (Signed)
HPI: Matthew Hull is a 72 y.o. male who  has a past medical history of Mixed hyperlipidemia and Unspecified essential hypertension.  he presents to [facility] today, 09/09/18,  for chief complaint of:  BP follow-up A1C recheck - prediabetic range 6 mos ago  BP looks good today, no CP/SOB, no HA/VC.  BP Readings from Last 3 Encounters:  09/09/18 125/76  02/26/18 (!) 142/81  02/19/17 135/67    A1C recheck due, was prediabetic range at 5.9% 6 mos ago. Has cut back on sweet teas, exercise about the same.   Statin benefit group, declines statins, I think reasonable given age.   Results for orders placed or performed in visit on 09/09/18 (from the past 24 hour(s))  POCT HgB A1C     Status: Abnormal   Collection Time: 09/09/18 11:51 AM  Result Value Ref Range   Hemoglobin A1C 6.2 (A) 4.0 - 5.6 %   HbA1c POC (<> result, manual entry)     HbA1c, POC (prediabetic range)     HbA1c, POC (controlled diabetic range)        At today's visit... Past medical history, surgical history, and family history reviewed and updated as needed.  Current medication list and allergy/intolerance information reviewed and updated as needed. (See remainder of HPI, ROS, Phys Exam below)          ASSESSMENT/PLAN: The primary encounter diagnosis was Prediabetes. A diagnosis of Essential hypertension was also pertinent to this visit.   Orders Placed This Encounter  Procedures  . POCT HgB A1C        Patient Instructions  Blood pressure looks good today!   Sugars: prediabetic range 5.9 six months ago Up to 6.2 today Let's recheck in 3 months!      Follow-up plan: Return in about 3 months (around 12/09/2018) for recheck A1C, see me sooner if  needed.                             ############################################ ############################################ ############################################ ############################################    Current Meds  Medication Sig  . aspirin 81 MG tablet Take 81 mg by mouth daily.    [name] atenolol-chlorthalidone (TENORETIC) 50-25 MG tablet Take 0.5 tablets by mouth 2 (two) times daily.  . Bromfenac Sodium (PROLENSA) 0.07 % SOLN Place 1 drop into both eyes 2 (two) times daily.  [name] FISH OIL-KRILL OIL PO Take 1,000 mg by mouth.  . Omega-3 Fatty Acids (SUPER TWIN EPA/DHA) 1250 MG CAPS Take 1 capsule by mouth daily.      Allergies  Allergen Reactions  . Atorvastatin Other (See Comments)    cramping        Review of Systems:  Constitutional: No recent illness  HEENT: No  headache, no vision change  Cardiac: No  chest pain, No  pressure, No palpitations  Respiratory:  No  shortness of breath. No  Cough  Gastrointestinal: No  abdominal pain, no change on bowel habits  Musculoskeletal: No new myalgia/arthralgia  Neurologic: No  weakness, No  Dizziness  Psychiatric: No  concerns with depression, No  concerns with anxiety  Exam:  BP 125/76 (BP Location: Left Arm, Patient Position: Sitting, Cuff Size: Normal)   Pulse 61   Temp 98 F (36.7 C) (Oral)   Wt 239 lb 1.6 oz (108.5 kg)   BMI 32.43 kg/m   Constitutional: VS see above. General Appearance: alert, well-developed, well-nourished, NAD  Eyes: Normal lids and  conjunctive, non-icteric sclera  Ears, Nose, Mouth, Throat: MMM, Normal external inspection ears/nares/mouth/lips/gums.  Neck: No masses, trachea midline.   Respiratory: Normal respiratory effort. no wheeze, no rhonchi, no rales  Cardiovascular: S1/S2 normal, no murmur, no rub/gallop auscultated. RRR.   Musculoskeletal: Gait normal. Symmetric and independent movement of all extremities  Neurological: Normal  balance/coordination. No tremor.  Skin: warm, dry, intact.   Psychiatric: Normal judgment/insight. Normal mood and affect. Oriented x3.       Visit summary with medication list and pertinent instructions was printed for patient to review, patient was advised to alert [place] if any updates are needed. All questions at time of visit were answered - patient instructed to contact office with any additional concerns. ER/RTC precautions were reviewed with the patient and understanding verbalized.   Note: Total time spent 25 minutes, greater than 50% of the visit was spent face-to-face counseling and coordinating care for the following: The primary encounter diagnosis was Prediabetes. A diagnosis of Essential hypertension was also pertinent to this visit.[name]  Please note: voice recognition software was used to produce this document, and typos may escape review. Please contact Dr. Sheppard Coil for any needed clarifications.    Follow up plan: No follow-ups on file.

## 2018-12-09 ENCOUNTER — Encounter: Payer: Self-pay | Admitting: Osteopathic Medicine

## 2018-12-09 ENCOUNTER — Ambulatory Visit (INDEPENDENT_AMBULATORY_CARE_PROVIDER_SITE_OTHER): Payer: Medicare Other | Admitting: Osteopathic Medicine

## 2018-12-09 VITALS — BP 119/73 | HR 73 | Temp 99.0°F | Wt 242.0 lb

## 2018-12-09 DIAGNOSIS — R7303 Prediabetes: Secondary | ICD-10-CM

## 2018-12-09 DIAGNOSIS — I10 Essential (primary) hypertension: Secondary | ICD-10-CM | POA: Diagnosis not present

## 2018-12-09 DIAGNOSIS — Z0000 Encounter for general adult medical examination without abnormal findings: Secondary | ICD-10-CM

## 2018-12-09 LAB — POCT GLYCOSYLATED HEMOGLOBIN (HGB A1C): Hemoglobin A1C: 6 % — AB (ref 4.0–5.6)

## 2018-12-09 NOTE — Progress Notes (Signed)
HPI: Matthew Hull is a 73 y.o. male who  has a past medical history of Mixed hyperlipidemia and Unspecified essential hypertension.  he presents to [facility] today, 12/09/18,  for chief complaint of:  BP follow-up to monitor  A1C recheck - prediabetic range  BP looks good today, no CP/SOB, no HA/VC.  BP Readings from Last 3 Encounters:  12/09/18 119/73  09/09/18 125/76  02/26/18 (!) 142/81    A1C recheck due, was prediabetic range at 5.9% 9 mos ago. He cut back on sweet teas, exercise was about the same. Recheck A1C 08/2018 was 6.2% and pt decided to defer meds. A1C at 6.0% today   Statin benefit group, has declined statins, I think reasonable given age.   Results for orders placed or performed in visit on 12/09/18 (from the past 24 hour(s))  POCT HgB A1C     Status: Abnormal   Collection Time: 12/09/18 10:45 AM  Result Value Ref Range   Hemoglobin A1C 6.0 (A) 4.0 - 5.6 %   HbA1c POC (<> result, manual entry)     HbA1c, POC (prediabetic range)     HbA1c, POC (controlled diabetic range)         At today's visit... Past medical history, surgical history, and family history reviewed and updated as needed.  Current medication list and allergy/intolerance information reviewed and updated as needed. (See remainder of HPI, ROS, Phys Exam below)         ASSESSMENT/PLAN: The primary encounter diagnosis was Prediabetes. Diagnoses of Annual physical exam and Essential hypertension were also pertinent to this visit.  Annual physical - labs ordered for future visit, preventive care was NOT performed/billed today.   Orders Placed This Encounter  Procedures  . CBC  . COMPLETE METABOLIC PANEL WITH GFR  . Lipid panel  . Hemoglobin A1c  . POCT HgB A1C          Follow-up plan: Return in about 6 months (around 06/11/2019) for annual physical - labs prior to visit. See me sooner if  needed.                             ############################################ ############################################ ############################################ ############################################    Current Meds  Medication Sig  . aspirin 81 MG tablet Take 81 mg by mouth daily.    [name] atenolol-chlorthalidone (TENORETIC) 50-25 MG tablet Take 0.5 tablets by mouth 2 (two) times daily.  . Bromfenac Sodium (PROLENSA) 0.07 % SOLN Place 1 drop into both eyes 2 (two) times daily.  . Omega-3 Fatty Acids (SUPER TWIN EPA/DHA) 1250 MG CAPS Take 1 capsule by mouth daily.      Allergies  Allergen Reactions  . Atorvastatin Other (See Comments)    cramping        Review of Systems:  Constitutional: No recent illness  HEENT: No  headache, no vision change  Cardiac: No  chest pain, No  pressure, No palpitations  Respiratory:  No  shortness of breath. +getting over a cough  Gastrointestinal: No  abdominal pain, no change on bowel habits  Musculoskeletal: No new myalgia/arthralgia  Neurologic: No  weakness, No  Dizziness  Psychiatric: No  concerns with depression, No  concerns with anxiety  Exam:  BP 119/73 (BP Location: Left Arm, Patient Position: Sitting, Cuff Size: Normal)   Pulse 73   Temp 99 F (37.2 C) (Oral)   Wt 242 lb (109.8 kg)   BMI 32.82 kg/m  Constitutional: VS see above. General Appearance: alert, well-developed, well-nourished, NAD  Eyes: Normal lids and conjunctive, non-icteric sclera  Ears, Nose, Mouth, Throat: MMM, Normal external inspection ears/nares/mouth/lips/gums.  Neck: No masses, trachea midline.   Respiratory: Normal respiratory effort. no wheeze, no rhonchi, no rales  Cardiovascular: S1/S2 normal, no murmur, no rub/gallop auscultated. RRR.   Musculoskeletal: Gait normal. Symmetric and independent movement of all extremities  Neurological: Normal balance/coordination. No tremor.  Skin: warm, dry,  intact.   Psychiatric: Normal judgment/insight. Normal mood and affect. Oriented x3.       Visit summary with medication list and pertinent instructions was printed for patient to review, patient was advised to alert [place] if any updates are needed. All questions at time of visit were answered - patient instructed to contact office with any additional concerns. ER/RTC precautions were reviewed with the patient and understanding verbalized.   Note: Total time spent 15 minutes, greater than 50% of the visit was spent face-to-face counseling and coordinating care for the following: The primary encounter diagnosis was Prediabetes. Diagnoses of Annual physical exam and Essential hypertension were also pertinent to this visit.[name]  Please note: voice recognition software was used to produce this document, and typos may escape review. Please contact Dr. Sheppard Coil for any needed clarifications.    Follow up plan: Return in about 6 months (around 06/11/2019) for annual physical - labs prior to visit. See me sooner if needed.

## 2019-01-20 ENCOUNTER — Other Ambulatory Visit: Payer: Self-pay

## 2019-01-20 ENCOUNTER — Ambulatory Visit: Payer: Medicare Other

## 2019-02-25 DIAGNOSIS — Z043 Encounter for examination and observation following other accident: Secondary | ICD-10-CM | POA: Diagnosis not present

## 2019-02-25 DIAGNOSIS — I10 Essential (primary) hypertension: Secondary | ICD-10-CM | POA: Diagnosis not present

## 2019-02-25 DIAGNOSIS — G8911 Acute pain due to trauma: Secondary | ICD-10-CM | POA: Diagnosis not present

## 2019-02-25 DIAGNOSIS — Z79899 Other long term (current) drug therapy: Secondary | ICD-10-CM | POA: Diagnosis not present

## 2019-02-25 DIAGNOSIS — Z888 Allergy status to other drugs, medicaments and biological substances status: Secondary | ICD-10-CM | POA: Diagnosis not present

## 2019-02-25 DIAGNOSIS — Z7982 Long term (current) use of aspirin: Secondary | ICD-10-CM | POA: Diagnosis not present

## 2019-02-25 DIAGNOSIS — S8991XA Unspecified injury of right lower leg, initial encounter: Secondary | ICD-10-CM | POA: Diagnosis not present

## 2019-02-28 NOTE — Progress Notes (Signed)
Subjective:   Matthew Hull is a 73 y.o. male who presents for Medicare Annual/Subsequent preventive examination.  Review of Systems:  No ROS.  Medicare Wellness Virtual Visit.  Visual/audio telehealth visit, UTA vital signs.   See social history for additional risk factors.    Cardiac Risk Factors include: advanced age (>59men, >65 women);hypertension;male gender  Sleep patterns:Getting 6-8 hours of sleep a night. Wakes up at least 1 time to go to the bathroom. Wakes up feeling refreshed.   Home Safety/Smoke Alarms: Feels safe in home. Smoke alarms in place.  Living environment; Lives with wife in 2 story home steps have hand rails present. SHower is walk in shower with grab bars in place. Seat Belt Safety/Bike Helmet: Wears seat belt.  Male:   CCS-  Patient states had once done within the year in [place]   PSA- ordered Lab Results  Component Value Date   PSA 1.60 05/28/2013        Objective:    Vitals: BP (!) 162/93   Pulse 75   Ht 6' (1.829 m)   Wt 235 lb (106.6 kg)   BMI 31.87 kg/m   Body mass index is 31.87 kg/m.  Advanced Directives 03/02/2019 02/16/2014  Does Patient Have a Medical Advance Directive? No Patient would not like information;Patient does not have advance directive  Would patient like information on creating a medical advance directive? No - Patient declined -    Tobacco Social History   Tobacco Use  Smoking Status Never Smoker  Smokeless Tobacco Never Used     Counseling given: No   Clinical Intake:  Pre-visit preparation completed: Yes  Pain : 0-10 Pain Score: 2  Pain Type: Acute pain Pain Location: Knee Pain Orientation: Right, Left Pain Radiating Towards: no Pain Descriptors / Indicators: Aching Pain Onset: More than a month ago Pain Frequency: Intermittent Pain Relieving Factors: Ibuprofen 800mg  Effect of Pain on Daily Activities: none  Pain Relieving Factors: Ibuprofen 800mg   Nutritional Risks: None Diabetes:  No  How often do you need to have someone help you when you read instructions, pamphlets, or other written materials from your doctor or pharmacy?: 1 - Never What is the last grade level you completed in school?: 14  Interpreter Needed?: No  Information entered by :: Orlie Dakin, LPN  Past Medical History:  Diagnosis Date  . Cataract    bilateral  . Mixed hyperlipidemia   . Unspecified essential hypertension    unspecified   Past Surgical History:  Procedure Laterality Date  . Curettage osteomyelitis focus; right middle finger    . I&D of skin and subcutaneous tissue, bone and nonviable, necrotic tissure, right middle finger     Family History  Problem Relation Age of Onset  . Throat cancer Father        throat/neck  . Other Mother        pacemaker   Social History   Socioeconomic History  . Marital status: Married    Spouse name: [name]  . Number of children: 5  . Years of education: 44  . Highest education level: Associate degree: academic program  Occupational History  . 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 Financial resource strain: Not hard at all  . Food insecurity:    Worry: Never true    Inability: Never true  . Transportation needs:    Medical: No    Non-medical: No  Tobacco Use  . Smoking status: Never Smoker  .  Smokeless tobacco: Never Used  Substance and Sexual Activity  . Alcohol use: Yes    Alcohol/week: 1.0 standard drinks    Types: 1 Cans of beer per week    Comment: occasionally  . Drug use: No  . Sexual activity: Not Currently    Birth control/protection: Abstinence  Lifestyle  . Physical activity:    Days per week: 0 days    Minutes per session: 0 min  . Stress: Not at all  Relationships  . Social connections:    Talks on phone: Three times a week    Gets together: Once a week    Attends religious service: More than 4 times per year    Active member of club or organization: Yes    Attends  meetings of clubs or organizations: More than 4 times per year    Relationship status: Married  Other Topics Concern  . Not on file  Social History Narrative   Customer service manager of trustees at [employer]   Very active   Coffee 2 in the mornings    Outpatient Encounter Medications as of 03/02/2019  Medication Sig  . aspirin 81 MG tablet Take 81 mg by mouth daily.    [name] atenolol-chlorthalidone (TENORETIC) 50-25 MG tablet Take 0.5 tablets by mouth 2 (two) times daily.  [name] FISH OIL-KRILL OIL PO Take 1,000 mg by mouth.  . Omega-3 Fatty Acids (SUPER TWIN EPA/DHA) 1250 MG CAPS Take 1 capsule by mouth daily.    . [DISCONTINUED] Bromfenac Sodium (PROLENSA) 0.07 % SOLN Place 1 drop into both eyes 2 (two) times daily.   No facility-administered encounter medications on file as of 03/02/2019.     Activities of Daily Living In your present state of health, do you have any difficulty performing the following activities: 03/02/2019  Hearing? N  Vision? N  Difficulty concentrating or making decisions? N  Walking or climbing stairs? Y  Comment not with knee pain  Dressing or bathing? N  Doing errands, shopping? N  Preparing Food and eating ? N  Using the Toilet? N  In the past six months, have you accidently leaked urine? N  Do you have problems with loss of bowel control? N  Managing your Medications? N  Managing your Finances? N  Housekeeping or managing your Housekeeping? N  Some recent data might be hidden    Patient Care Team: Emeterio Reeve, DO as PCP - General (Osteopathic Medicine)   Assessment:   This is a routine wellness examination for Matthew Hull.Physical assessment deferred to PCP.   Exercise Activities and Dietary recommendations Current Exercise Habits: The patient does not participate in regular exercise at present, Exercise limited by: orthopedic condition(s) Diet Eats a healthy diet Breakfast: pancakes, sausage, bacon and eggs Lunch: sandwich Dinner: Meat and  vegetables, rice, salad      Goals    . Patient Stated     Patient states would like to get in better shape and get his knees pain free       Fall Risk Fall Risk  03/02/2019 09/05/2017 07/23/2016 02/16/2014  Falls in the past year? 1 No No No  Comment - Emmi Telephone Survey: data to providers prior to load - -  Number falls in past yr: 0 - - -  Injury with Fall? 1 - - -  Follow up Falls prevention discussed - - -   Is the patient's home free of loose throw rugs in walkways, pet beds, electrical cords, etc?  yes      Grab bars in the bathroom? yes      Handrails on the stairs?   yes      Adequate lighting?   yes   Depression Screen PHQ 2/9 Scores 03/02/2019 12/09/2018 02/26/2018 07/23/2016  PHQ - 2 Score 0 0 0 0  PHQ- 9 Score - 2 0 -    Cognitive Function     6CIT Screen 03/02/2019  What Year? 0 points  What month? 0 points  What time? 0 points  Count back from 20 0 points  Months in reverse 0 points  Repeat phrase 0 points  Total Score 0    Immunization History  Administered Date(s) Administered  . Influenza, High Dose Seasonal PF 08/28/2017, 09/01/2018  . Influenza-Unspecified 07/01/2014  . Pneumococcal Conjugate-13 03/20/2016  . Pneumococcal Polysaccharide-23 02/26/2018    Screening Tests Health Maintenance  Topic Date Due  . Hepatitis C Screening  02-13-1946  . TETANUS/TDAP  05/26/1965  . COLONOSCOPY  05/26/1996  . INFLUENZA VACCINE  05/02/2019  . PNA vac Low Risk Adult  Completed        Plan:    Please schedule your next medicare wellness visit with me in 1 yr.  Matthew Hull , Thank you for taking time to come for your Medicare Wellness Visit. I appreciate your ongoing commitment to your health goals. Please review the following plan we discussed and let me know if I can assist you in the future.   These are the goals we discussed: Goals    . Patient Stated     Patient states would like to get in better shape and get his knees pain free       This  is a list of the screening recommended for you and due dates:  Health Maintenance  Topic Date Due  .  Hepatitis C: One time screening is recommended by Center for Disease Control  (CDC) for  adults born from [birth year] through 1965.   May 30, 1946  . Tetanus Vaccine  05/26/1965  . Colon Cancer Screening  05/26/1996  . Flu Shot  05/02/2019  . Pneumonia vaccines  Completed     I have personally reviewed and noted the following in the patient's chart:   . Medical and social history . Use of alcohol, tobacco or illicit drugs  . Current medications and supplements . Functional ability and status . Nutritional status . Physical activity . Advanced directives . List of other physicians . Hospitalizations, surgeries, and ER visits in previous 12 months . Vitals . Screenings to include cognitive, depression, and falls . Referrals and appointments  In addition, I have reviewed and discussed with patient certain preventive protocols, quality metrics, and best practice recommendations. A written personalized care plan for preventive services as well as general preventive health recommendations were provided to patient.     Joanne Chars, LPN  [id number]

## 2019-03-02 ENCOUNTER — Ambulatory Visit (INDEPENDENT_AMBULATORY_CARE_PROVIDER_SITE_OTHER): Payer: Medicare Other | Admitting: *Deleted

## 2019-03-02 VITALS — BP 162/93 | HR 75 | Ht 72.0 in | Wt 235.0 lb

## 2019-03-02 DIAGNOSIS — Z0000 Encounter for general adult medical examination without abnormal findings: Secondary | ICD-10-CM

## 2019-03-02 DIAGNOSIS — R351 Nocturia: Secondary | ICD-10-CM

## 2019-03-02 DIAGNOSIS — Z125 Encounter for screening for malignant neoplasm of prostate: Secondary | ICD-10-CM

## 2019-03-02 NOTE — Patient Instructions (Addendum)
Please schedule your next medicare wellness visit with me in 1 yr.  Matthew Hull , Thank you for taking time to come for your Medicare Wellness Visit. I appreciate your ongoing commitment to your health goals. Please review the following plan we discussed and let me know if I can assist you in the future.   These are the goals we discussed: Goals    . Patient Stated     Patient states would like to get in better shape and get his knees pain free

## 2019-03-04 ENCOUNTER — Other Ambulatory Visit: Payer: Self-pay

## 2019-03-04 ENCOUNTER — Ambulatory Visit (INDEPENDENT_AMBULATORY_CARE_PROVIDER_SITE_OTHER): Payer: Medicare Other

## 2019-03-04 ENCOUNTER — Ambulatory Visit (INDEPENDENT_AMBULATORY_CARE_PROVIDER_SITE_OTHER): Payer: Medicare Other | Admitting: Family Medicine

## 2019-03-04 VITALS — BP 146/80 | HR 73 | Temp 98.0°F | Wt 249.0 lb

## 2019-03-04 DIAGNOSIS — M25562 Pain in left knee: Secondary | ICD-10-CM

## 2019-03-04 DIAGNOSIS — Z125 Encounter for screening for malignant neoplasm of prostate: Secondary | ICD-10-CM | POA: Diagnosis not present

## 2019-03-04 DIAGNOSIS — Z0000 Encounter for general adult medical examination without abnormal findings: Secondary | ICD-10-CM | POA: Diagnosis not present

## 2019-03-04 DIAGNOSIS — M25561 Pain in right knee: Secondary | ICD-10-CM | POA: Diagnosis not present

## 2019-03-04 DIAGNOSIS — R351 Nocturia: Secondary | ICD-10-CM | POA: Diagnosis not present

## 2019-03-04 DIAGNOSIS — M180 Bilateral primary osteoarthritis of first carpometacarpal joints: Secondary | ICD-10-CM

## 2019-03-04 MED ORDER — DICLOFENAC SODIUM 1 % TD GEL: 4.0000 g | Freq: Four times a day (QID) | TRANSDERMAL | 11 refills | Status: DC

## 2019-03-04 NOTE — Progress Notes (Signed)
Matthew Hull is a 73 y.o. male who presents to [facility] today for knee pain.  About 6 weeks ago he was walking and stepped in a hole hyperextending his left knee.  He has had some moderate knee pain and swelling since.  He notes a bit of limping.  He is tried ice and a knee brace and some ibuprofen and Aleve which helps a little.  He denies locking or catching.  He was doing okay until about 1 week ago when he twisted his right knee after stepping off of a curb.  That knee became painful and swollen.  He was seen in the emergency department on May 27 were x-rays of the right knee were significant only for mild arthritis.  He notes some clicking and catching in the right knee along with swelling and pain.  Again ibuprofen and Aleve have only helped a little.    ROS:  As above  Exam:  BP (!) 146/80   Pulse 73   Temp 98 F (36.7 C)   Wt 249 lb (112.9 kg)   BMI 33.77 kg/m   Wt Readings from Last 5 Encounters:  03/05/19 249 lb (112.9 kg)  03/02/19 235 lb (106.6 kg)  12/09/18 242 lb (109.8 kg)  09/09/18 239 lb 1.6 oz (108.5 kg)  02/26/18 240 lb (108.9 kg)   General: Well Developed, well nourished, and in no acute distress.  Neuro/Psych: Alert and oriented x3, extra-ocular muscles intact, able to move all 4 extremities, sensation grossly intact. Skin: Warm and dry, no rashes noted.  Respiratory: Not using accessory muscles, speaking in full sentences, trachea midline.  Cardiovascular: Pulses palpable, no extremity edema. Abdomen: Does not appear distended. MSK:  Right knee: Moderate effusion no erythema no deformity. Range of motion 5-110 degrees. Tender palpation medial lateral joint line. Stable ligamentous exam. Negative Murray's test. Intact strength.  Left knee: Moderate effusion.  No erythema or deformity. Range of motion 5-110 degrees. Tender palpation medial and lateral joint line. Stable ligamentous exam. Mildly positive  McMurray's test. Intactstrength    Lab and Radiology Results X-ray images personally reviewed. X-ray left knee and 2 view AP right knee.  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  Mild patellofemoral DJD.  No acute fractures.  Left knee: Moderate medial compartment DJD.  No acute fractures.  Await for radiology review    Assessment and Plan: 73 y.o. male with bilateral knee pain due to recent injury in the setting of DJD.  Pain likely due to exacerbation of DJD versus meniscus injury versus other issue such as gout however gout is less likely.  After discussion of treatment plan and options plan for trial of diclofenac gel compression and relative rest.  Patient would like to avoid injection if possible.  If not improving next that would be return to clinic for steroid injection.   PDMP not reviewed this encounter. Orders Placed This Encounter  Procedures  . DG Knee 1-2 Views Right    Standing Status:   Future    Number of Occurrences:   1    Standing Expiration Date:   05/04/2020    Order Specific Question:   Reason for Exam (SYMPTOM  OR DIAGNOSIS REQUIRED)    Answer:   For use with the left knee x-ray bilateral AP and Rosenberg standing.    Order Specific Question:   Preferred imaging location?    Answer:   [facility]  . DG Knee Complete 4 Views  Left    Please include patellar sunrise, lateral, and weightbearing bilateral AP and bilateral rosenberg views    Standing Status:   Future    Number of Occurrences:   1    Standing Expiration Date:   05/03/2020    Order Specific Question:   Reason for exam:    Answer:   Please include patellar sunrise, lateral, and weightbearing bilateral AP and bilateral rosenberg views    Comments:   Please include patellar sunrise, lateral, and weightbearing bilateral AP and bilateral rosenberg views    Order Specific Question:   Preferred imaging location?    Answer:   [facility]   Meds ordered this encounter   Medications  . diclofenac sodium (VOLTAREN) 1 % GEL    Sig: Apply 4 g topically 4 (four) times daily. To affected joint.    Dispense:  100 g    Refill:  11    Tried and failed ibuprofen and aleve    Historical information moved to improve visibility of documentation.  Past Medical History:  Diagnosis Date  . Cataract    bilateral  . Mixed hyperlipidemia   . Unspecified essential hypertension    unspecified   Past Surgical History:  Procedure Laterality Date  . Curettage osteomyelitis focus; right middle finger    . I&D of skin and subcutaneous tissue, bone and nonviable, necrotic tissure, right middle finger     Social History   Tobacco Use  . Smoking status: Never Smoker  . Smokeless tobacco: Never Used  Substance Use Topics  . Alcohol use: Yes    Alcohol/week: 1.0 standard drinks    Types: 1 Cans of beer per week    Comment: occasionally   family history includes Other in his mother; Throat cancer in his father.  Medications: Current Outpatient Medications  Medication Sig Dispense Refill  . aspirin 81 MG tablet Take 81 mg by mouth daily.      [name] atenolol-chlorthalidone (TENORETIC) 50-25 MG tablet Take 0.5 tablets by mouth 2 (two) times daily. 90 tablet 3  . diclofenac sodium (VOLTAREN) 1 % GEL Apply 4 g topically 4 (four) times daily. To affected joint. 100 g 11  . FISH OIL-KRILL OIL PO Take 1,000 mg by mouth.    . Omega-3 Fatty Acids (SUPER TWIN EPA/DHA) 1250 MG CAPS Take 1 capsule by mouth daily.       No current facility-administered medications for this visit.    Allergies  Allergen Reactions  . Atorvastatin Other (See Comments)    cramping       Discussed warning signs or symptoms. Please see discharge instructions. Patient expresses understanding.

## 2019-03-04 NOTE — Patient Instructions (Signed)
Thank you for coming in today. Use diclofenac gel.  Use compression sleeve and ice.  If not improving ok to return for injection.

## 2019-03-05 LAB — PSA: PSA: 3.4 ng/mL (ref <=4.0)

## 2019-03-16 ENCOUNTER — Ambulatory Visit (INDEPENDENT_AMBULATORY_CARE_PROVIDER_SITE_OTHER): Payer: Medicare Other | Admitting: Family Medicine

## 2019-03-16 ENCOUNTER — Encounter: Payer: Self-pay | Admitting: Family Medicine

## 2019-03-16 VITALS — BP 170/88 | HR 70 | Temp 97.8°F | Wt 250.0 lb

## 2019-03-16 DIAGNOSIS — M25561 Pain in right knee: Secondary | ICD-10-CM | POA: Diagnosis not present

## 2019-03-16 DIAGNOSIS — M25562 Pain in left knee: Secondary | ICD-10-CM | POA: Diagnosis not present

## 2019-03-16 NOTE — Progress Notes (Signed)
Matthew Hull is a 73 y.o. male who presents to [facility] today for bilateral knee pain.  Patient was seen about a week and a half ago for bilateral knee pain.  He had trial of conservative management including relative rest and diclofenac gel and failed to improve.  He notes pain and swelling and is interested in knee injection today if possible.  No repeat injury fevers or chills locking or catching.    ROS:  As above  Exam:  BP (!) 170/88   Pulse 70   Temp 97.8 F (36.6 C) (Oral)   Wt 250 lb (113.4 kg)   BMI 33.91 kg/m  Wt Readings from Last 5 Encounters:  03/16/19 250 lb (113.4 kg)  03/05/19 249 lb (112.9 kg)  03/02/19 235 lb (106.6 kg)  12/09/18 242 lb (109.8 kg)  09/09/18 239 lb 1.6 oz (108.5 kg)   General: Well Developed, well nourished, and in no acute distress.  Neuro/Psych: Alert and oriented x3, extra-ocular muscles intact, able to move all 4 extremities, sensation grossly intact. Skin: Warm and dry, no rashes noted.  Respiratory: Not using accessory muscles, speaking in full sentences, trachea midline.  Cardiovascular: Pulses palpable, no extremity edema. Abdomen: Does not appear distended. MSK:  Right knee: Moderate effusion.  No deformity or erythema.  Range of motion 5-110 degrees.  Tender palpation medial lateral joint lines.  Stable ligaments exam.  Negative  McMurray's test.  Intact strength.  Left knee: Normal-appearing with mild effusion.  No deformity or erythema.  Range of motion 5-110 degrees.  Tender palpation medial and lateral joint lines.  See ligaments exam.  Mildly positive Murray's test.  Intact strength.    Lab and Radiology Results Dg Knee 1-2 Views Right  Result Date: 03/05/2019 CLINICAL DATA:  Acute right knee pain. EXAM: RIGHT KNEE - 1-2 VIEW COMPARISON:  None. FINDINGS: No evidence of fracture, dislocation, or joint effusion. Moderate narrowing of medial joint space is noted. Soft tissues are  unremarkable. IMPRESSION: Moderate degenerative joint disease is noted medially. No acute abnormality seen in the right knee. Electronically Signed   By: Marijo Conception M.D.   On: 03/05/2019 08:28   Dg Knee Complete 4 Views Left  Result Date: 03/05/2019 CLINICAL DATA:  Left knee pain after fall 6 weeks ago. EXAM: LEFT KNEE - COMPLETE 4+ VIEW COMPARISON:  None. FINDINGS: No evidence of fracture, dislocation, or joint effusion. Moderate narrowing of the medial joint space is noted. Soft tissues are unremarkable. IMPRESSION: Moderate degenerative joint disease is noted medially. No acute abnormality seen in the left knee. Electronically Signed   By: Marijo Conception M.D.   On: 03/05/2019 08:29   I personally (independently) visualized and performed the interpretation of the images attached in this note.   Procedure: Real-time Ultrasound Guided Injection of right knee Device: GE Logiq E   Images permanently stored and available for review in the ultrasound unit. Verbal informed consent obtained.  Discussed risks and benefits of procedure. Warned about infection bleeding damage to structures skin hypopigmentation and fat atrophy among others. Patient expresses understanding and agreement Time-out conducted.   Noted no overlying erythema, induration, or other signs of local infection.   Skin prepped in a sterile fashion.   Local anesthesia: Topical Ethyl chloride.   With sterile technique and under real time ultrasound guidance:  40 mg of Kenalog and 4 mL of Marcaine injected easily.   Completed without difficulty   Pain immediately resolved suggesting  accurate placement of the medication.   Advised to call if fevers/chills, erythema, induration, drainage, or persistent bleeding.   Images permanently stored and available for review in the ultrasound unit.  Impression: Technically successful ultrasound guided injection.  Procedure: Real-time Ultrasound Guided Injection of left knee Device: GE Logiq  E   Images permanently stored and available for review in the ultrasound unit. Verbal informed consent obtained.  Discussed risks and benefits of procedure. Warned about infection bleeding damage to structures skin hypopigmentation and fat atrophy among others. Patient expresses understanding and agreement Time-out conducted.   Noted no overlying erythema, induration, or other signs of local infection.   Skin prepped in a sterile fashion.   Local anesthesia: Topical Ethyl chloride.   With sterile technique and under real time ultrasound guidance:  40 mg of Kenalog and 4 mL of Marcaine injected easily.   Completed without difficulty   Pain immediately resolved suggesting accurate placement of the medication.   Advised to call if fevers/chills, erythema, induration, drainage, or persistent bleeding.   Images permanently stored and available for review in the ultrasound unit.  Impression: Technically successful ultrasound guided injection.     Assessment and Plan: 73 y.o. male with knee pain bilaterally likely exacerbation of DJD possible meniscus injury.  Failing initial conservative management.  Plan for trial of bilateral steroid injection.  Recheck if not improving.  Recheck sooner if needed.  Precautions reviewed.   PDMP not reviewed this encounter. No orders of the defined types were placed in this encounter.  No orders of the defined types were placed in this encounter.   Historical information moved to improve visibility of documentation.  Past Medical History:  Diagnosis Date  . Cataract    bilateral  . Mixed hyperlipidemia   . Unspecified essential hypertension    unspecified   Past Surgical History:  Procedure Laterality Date  . Curettage osteomyelitis focus; right middle finger    . I&D of skin and subcutaneous tissue, bone and nonviable, necrotic tissure, right middle finger     Social History   Tobacco Use  . Smoking status: Never Smoker  . Smokeless tobacco:  Never Used  Substance Use Topics  . Alcohol use: Yes    Alcohol/week: 1.0 standard drinks    Types: 1 Cans of beer per week    Comment: occasionally   family history includes Other in his mother; Throat cancer in his father.  Medications: Current Outpatient Medications  Medication Sig Dispense Refill  . aspirin 81 MG tablet Take 81 mg by mouth daily.      [name] atenolol-chlorthalidone (TENORETIC) 50-25 MG tablet Take 0.5 tablets by mouth 2 (two) times daily. 90 tablet 3  . diclofenac sodium (VOLTAREN) 1 % GEL Apply 4 g topically 4 (four) times daily. To affected joint. 100 g 11  . FISH OIL-KRILL OIL PO Take 1,000 mg by mouth.    . Omega-3 Fatty Acids (SUPER TWIN EPA/DHA) 1250 MG CAPS Take 1 capsule by mouth daily.       No current facility-administered medications for this visit.    Allergies  Allergen Reactions  . Atorvastatin Other (See Comments)    cramping       Discussed warning signs or symptoms. Please see discharge instructions. Patient expresses understanding.

## 2019-03-16 NOTE — Patient Instructions (Addendum)
Thank you for coming in today. Call or go to the ER if you develop a large red swollen joint with extreme pain or oozing puss.  Keep me updated.  Use the diclofenac gel.  Recheck as needed.  If not better.

## 2019-03-19 ENCOUNTER — Encounter: Payer: Self-pay | Admitting: Family Medicine

## 2019-04-08 ENCOUNTER — Encounter: Payer: Self-pay | Admitting: Family Medicine

## 2019-05-16 ENCOUNTER — Other Ambulatory Visit: Payer: Self-pay | Admitting: Osteopathic Medicine

## 2019-05-16 DIAGNOSIS — I10 Essential (primary) hypertension: Secondary | ICD-10-CM

## 2019-05-16 NOTE — Telephone Encounter (Signed)
Please advise 

## 2019-06-11 ENCOUNTER — Ambulatory Visit (INDEPENDENT_AMBULATORY_CARE_PROVIDER_SITE_OTHER): Payer: Medicare Other | Admitting: Osteopathic Medicine

## 2019-06-11 ENCOUNTER — Other Ambulatory Visit: Payer: Self-pay

## 2019-06-11 ENCOUNTER — Encounter: Payer: Self-pay | Admitting: Osteopathic Medicine

## 2019-06-11 VITALS — BP 142/82 | HR 68 | Temp 98.2°F | Wt 238.8 lb

## 2019-06-11 DIAGNOSIS — R7303 Prediabetes: Secondary | ICD-10-CM

## 2019-06-11 DIAGNOSIS — I10 Essential (primary) hypertension: Secondary | ICD-10-CM

## 2019-06-11 DIAGNOSIS — E785 Hyperlipidemia, unspecified: Secondary | ICD-10-CM

## 2019-06-11 DIAGNOSIS — Z23 Encounter for immunization: Secondary | ICD-10-CM

## 2019-06-11 MED ORDER — TETANUS-DIPHTH-ACELL PERTUSSIS 5-2-15.5 LF-MCG/0.5 IM SUSP: 0.5000 mL | Freq: Once | INTRAMUSCULAR | 0 refills | Status: AC

## 2019-06-11 MED ORDER — ZOSTER VAC RECOMB ADJUVANTED 50 MCG/0.5ML IM SUSR: 0.5000 mL | Freq: Once | INTRAMUSCULAR | 1 refills | Status: AC

## 2019-06-11 NOTE — Patient Instructions (Addendum)
General Preventive Care  Most recent routine screening lipids/other labs: ordered  BP: goal 140/90  Tobacco: don't!   Alcohol: responsible moderation is ok for most adults - if you have concerns about your alcohol intake, please talk to me!   Exercise: as tolerated to reduce risk of cardiovascular disease and diabetes. Strength training will also prevent osteoporosis.   Mental health: if need for mental health care (medicines, counseling, other), or concerns about moods, please let me know!   Sexual health: if need for STD testing, or if concerns with libido/pain problems, please let me know!  Advanced Directive: Living Will and/or Healthcare Power of Attorney recommended for all adults, regardless of age or health.  Vaccines  Flu vaccine: recommended for almost everyone, every fall.   Shingles vaccine: Shingrix recommended after age 72.   Pneumonia vaccines: all done  Tetanus booster: Tdap recommended every 10 years.  Cancer screenings   Colon cancer screening: recommended for everyone at age 40, but some folks need a colonoscopy sooner if risk factors   Prostate cancer screening: PSA blood test around age 22-75, done this year   Lung cancer screening: not needed for non-smokers  Infection screenings . HIV, Gonorrhea/Chlamydia: screening as needed . Hepatitis C: recommended for anyone born 64-1965 . TB: certain at-risk populations, or depending on work requirements and/or travel history Other . Bone Density Test: recommended for men at age 21, sooner depending on risk factors

## 2019-06-11 NOTE — Progress Notes (Signed)
HPI: Matthew Hull is a 73 y.o. male who  has a past medical history of Cataract, Mixed hyperlipidemia, and Unspecified essential hypertension.  he presents to [facility] today, 06/11/19,  for chief complaint of: F/u HTN, HLD, labs   Patient doing well overall since last seen.  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  hypertension: No chest pain, pressure, shortness of breath.  Patient has not really been checking his blood pressures at home.  Hyperlipidemia: Due for labs, these were ordered back in March but he has not had this done yet.    Past medical, surgical, social and family history reviewed:  Patient Active Problem List   Diagnosis Date Noted  . Prediabetes 09/09/2018  . 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 Hyperlipidemia 10/11/2014  . Essential hypertension 10/11/2014    Past Surgical History:  Procedure Laterality Date  . Curettage osteomyelitis focus; right middle finger    . I&D of skin and subcutaneous tissue, bone and nonviable, necrotic tissure, right middle finger      Social History   Tobacco Use  . Smoking status: Never Smoker  . Smokeless tobacco: Never Used  Substance Use Topics  . Alcohol use: Yes    Alcohol/week: 1.0 standard drinks    Types: 1 Cans of beer per week    Comment: occasionally    Family History  Problem Relation Age of Onset  . Throat cancer Father        throat/neck  . Other Mother        pacemaker     Current medication list and allergy/intolerance information reviewed:    Current Outpatient Medications  Medication Sig Dispense Refill  . aspirin 81 MG tablet Take 81 mg by mouth daily.      [name] atenolol-chlorthalidone (TENORETIC) 50-25 MG tablet TAKE 0.5 TABLETS BY MOUTH 2 (TWO) TIMES DAILY. 90 tablet 3  . diclofenac sodium (VOLTAREN) 1 % GEL Apply 4 g topically 4 (four) times daily. To affected joint. 100 g 11  . FISH  OIL-KRILL OIL PO Take 1,000 mg by mouth.    . Omega-3 Fatty Acids (SUPER TWIN EPA/DHA) 1250 MG CAPS Take 1 capsule by mouth daily.       No current facility-administered medications for this visit.     Allergies  Allergen Reactions  . Atorvastatin Other (See Comments)    cramping       Review of Systems:  Constitutional:  No  fever, no chills, No recent illness  HEENT: No  headache, no vision change, no hearing change, No sore throat, No  sinus pressure  Cardiac: No  chest pain, No  pressure, No palpitations, No  Orthopnea    Respiratory:  No  shortness of breath. No  Cough  Gastrointestinal: No  abdominal pain, No  nausea  Musculoskeletal: No new myalgia/arthralgia  Skin: No  Rash  Hem/Onc: No  easy bruising/bleeding  Neurologic: No  weakness, No  dizziness, No  slurred speech/focal weakness/facial droop  Psychiatric: No  concerns with depression, No  concerns with anxiety, No sleep problems, No mood problems  Exam:  BP (!) 142/82 (BP Location: Left Arm, Patient Position: Sitting, Cuff Size: Normal)   Pulse 68   Temp 98.2 F (36.8 C) (Oral)   Wt 238 lb 12.8 oz (108.3 kg)   BMI 32.39 kg/m   BP Readings from Last 3 Encounters:  06/11/19 (!) 142/82  03/16/19 (!) 170/88  03/05/19 (!) 146/80    Constitutional: VS see above. General Appearance: alert, well-developed, well-nourished, NAD  Eyes: Normal lids and conjunctive, non-icteric sclera  Neck: No masses, trachea midline. No thyroid enlargement. No tenderness/mass appreciated. No lymphadenopathy  Respiratory: Normal respiratory effort. no wheeze, no rhonchi, no rales  Cardiovascular: S1/S2 normal, no murmur, no rub/gallop auscultated. RRR. No lower extremity edema.  Gastrointestinal: Nontender, no masses. No hepatomegaly, no splenomegaly. No hernia appreciated. Bowel sounds normal. Rectal exam deferred.   Musculoskeletal: Gait normal. No clubbing/cyanosis of digits.   Neurological: Normal  balance/coordination. No tremor. No cranial nerve deficit on limited exam. Motor and sensation intact and symmetric. Cerebellar reflexes intact.   Skin: warm, dry, intact. No rash/ulcer. No concerning nevi or subq nodules on limited exam.    Psychiatric: Normal judgment/insight. Normal mood and affect. Oriented x3.      ASSESSMENT/PLAN: The primary encounter diagnosis was Essential hypertension. Diagnoses of Need for influenza vaccination, Hyperlipidemia, unspecified hyperlipidemia type, and Prediabetes were also pertinent to this visit.  Patient reports last colonoscopy was a couple years ago, 2 polyps were removed.  I do not have report available for review.  Patient states he is pretty sure he was told to follow-up around age 5   Orders Placed This Encounter  Procedures  . Flu Vaccine QUAD High Dose(Fluad)  . CBC  . COMPLETE METABOLIC PANEL WITH GFR  . Lipid panel  . Hemoglobin A1c    Meds ordered this encounter  Medications  . Zoster Vaccine Adjuvanted [facility]) injection    Sig: Inject 0.5 mLs into the muscle once for 1 dose. Repeat in 2-6 months. Please fax confirmation of vaccination to Dr Sheppard Coil [phone]    Dispense:  0.5 mL    Refill:  1  . Tdap (ADACEL) 01-30-14.5 LF-MCG/0.5 injection    Sig: Inject 0.5 mLs into the muscle once for 1 dose. Please fax confirmation of vaccination to Dr Sheppard Coil [phone]    Dispense:  0.5 mL    Refill:  0    Patient Instructions  General Preventive Care  Most recent routine screening lipids/other labs: ordered  BP: goal 140/90  Tobacco: don't!   Alcohol: responsible moderation is ok for most adults - if you have concerns about your alcohol intake, please talk to me!   Exercise: as tolerated to reduce risk of cardiovascular disease and diabetes. Strength training will also prevent osteoporosis.   Mental health: if need for mental health care (medicines, counseling, other), or concerns about moods, please let me know!    Sexual health: if need for STD testing, or if concerns with libido/pain problems, please let me know!  Advanced Directive: Living Will and/or Healthcare Power of Attorney recommended for all adults, regardless of age or health.  Vaccines  Flu vaccine: recommended for almost everyone, every fall.   Shingles vaccine: Shingrix recommended after age 26.   Pneumonia vaccines: all done  Tetanus booster: Tdap recommended every 10 years.  Cancer screenings   Colon cancer screening: recommended for everyone at age 49, but some folks need a colonoscopy sooner if risk factors   Prostate cancer screening: PSA blood test around age 74-75, done this year   Lung cancer screening: not needed for non-smokers  Infection screenings . HIV, Gonorrhea/Chlamydia: screening as needed . Hepatitis C: recommended for anyone born 52-1965 . TB: certain at-risk populations, or depending on work requirements and/or travel history Other . Bone Density Test: recommended for men at age 67, sooner depending on risk  factors        Visit summary with medication list and pertinent instructions was printed for patient to review. All questions at time of visit were answered - patient instructed to contact office with any additional concerns or updates. ER/RTC precautions were reviewed with the patient.   Note: Total time spent 25 minutes, greater than 50% of the visit was spent face-to-face counseling and coordinating care for the above diagnoses listed in assessment/plan.   Please note: voice recognition software was used to produce this document, and typos may escape review. Please contact Dr. Sheppard Coil for any needed clarifications.     Follow-up plan: Return in about 6 months (around 12/09/2019) for monitor blood pressure, sooner if needed .

## 2019-06-12 LAB — CBC
HCT: 46.7 % (ref 38.5–50.0)
Hemoglobin: 16.2 g/dL (ref 13.2–17.1)
MCH: 31.8 pg (ref 27.0–33.0)
MCHC: 34.7 g/dL (ref 32.0–36.0)
MCV: 91.7 fL (ref 80.0–100.0)
MPV: 9.6 fL (ref 7.5–12.5)
Platelets: 273 10*3/uL (ref 140–400)
RBC: 5.09 10*6/uL (ref 4.20–5.80)
RDW: 12.1 % (ref 11.0–15.0)
WBC: 6.3 10*3/uL (ref 3.8–10.8)

## 2019-06-12 LAB — HEMOGLOBIN A1C
Hgb A1c MFr Bld: 5.6 % of total Hgb (ref <5.7)
Mean Plasma Glucose: 114 (calc)
eAG (mmol/L): 6.3 (calc)

## 2019-06-12 LAB — COMPLETE METABOLIC PANEL WITH GFR
AG Ratio: 1.7 (calc) (ref 1.0–2.5)
ALT: 41 U/L (ref 9–46)
AST: 29 U/L (ref 10–35)
Albumin: 4.4 g/dL (ref 3.6–5.1)
Alkaline phosphatase (APISO): 67 U/L (ref 35–144)
BUN/Creatinine Ratio: 26 (calc) — ABNORMAL HIGH (ref 6–22)
BUN: 30 mg/dL — ABNORMAL HIGH (ref 7–25)
CO2: 27 mmol/L (ref 20–32)
Calcium: 10.1 mg/dL (ref 8.6–10.3)
Chloride: 104 mmol/L (ref 98–110)
Creat: 1.15 mg/dL (ref 0.70–1.18)
GFR, Est African American: 73 mL/min/{1.73_m2} (ref >=60)
GFR, Est Non African American: 63 mL/min/{1.73_m2} (ref >=60)
Globulin: 2.6 g/dL (calc) (ref 1.9–3.7)
Glucose, Bld: 98 mg/dL (ref 65–99)
Potassium: 3.7 mmol/L (ref 3.5–5.3)
Sodium: 141 mmol/L (ref 135–146)
Total Bilirubin: 0.6 mg/dL (ref 0.2–1.2)
Total Protein: 7 g/dL (ref 6.1–8.1)

## 2019-06-12 LAB — LIPID PANEL
Cholesterol: 197 mg/dL (ref <200)
HDL: 42 mg/dL (ref >=40)
LDL Cholesterol (Calc): 131 mg/dL (calc) — ABNORMAL HIGH
Non-HDL Cholesterol (Calc): 155 mg/dL (calc) — ABNORMAL HIGH (ref <130)
Total CHOL/HDL Ratio: 4.7 (calc) (ref <5.0)
Triglycerides: 126 mg/dL (ref <150)

## 2019-06-22 ENCOUNTER — Encounter: Payer: Self-pay | Admitting: Family Medicine

## 2019-06-24 ENCOUNTER — Encounter: Payer: Self-pay | Admitting: Family Medicine

## 2019-06-24 DIAGNOSIS — M25562 Pain in left knee: Secondary | ICD-10-CM

## 2019-06-24 DIAGNOSIS — G8929 Other chronic pain: Secondary | ICD-10-CM

## 2019-06-24 DIAGNOSIS — M170 Bilateral primary osteoarthritis of knee: Secondary | ICD-10-CM

## 2019-06-24 NOTE — Telephone Encounter (Signed)
I have filled out the form for [employer] and waiting on provider signature. I will fax once complete.

## 2019-07-02 ENCOUNTER — Encounter: Payer: Self-pay | Admitting: Family Medicine

## 2019-07-03 NOTE — Telephone Encounter (Signed)
Haley at [employer] reached out a couple of days ago and we were waiting on the [facility] number. I had to reach out to [employer]im[employer]izhoni for this and got a response. I will reach out to [facility] again and see what the status of the approval is at this time.

## 2019-07-06 ENCOUNTER — Encounter: Payer: Self-pay | Admitting: Family Medicine

## 2019-07-10 DIAGNOSIS — M25462 Effusion, left knee: Secondary | ICD-10-CM | POA: Diagnosis not present

## 2019-07-10 DIAGNOSIS — M25762 Osteophyte, left knee: Secondary | ICD-10-CM | POA: Diagnosis not present

## 2019-07-10 DIAGNOSIS — M1712 Unilateral primary osteoarthritis, left knee: Secondary | ICD-10-CM | POA: Diagnosis not present

## 2019-07-10 DIAGNOSIS — M25562 Pain in left knee: Secondary | ICD-10-CM | POA: Diagnosis not present

## 2019-07-10 DIAGNOSIS — M1711 Unilateral primary osteoarthritis, right knee: Secondary | ICD-10-CM | POA: Diagnosis not present

## 2019-07-11 ENCOUNTER — Encounter: Payer: Self-pay | Admitting: Family Medicine

## 2019-07-15 DIAGNOSIS — M1711 Unilateral primary osteoarthritis, right knee: Secondary | ICD-10-CM | POA: Diagnosis not present

## 2019-07-15 DIAGNOSIS — Z01810 Encounter for preprocedural cardiovascular examination: Secondary | ICD-10-CM | POA: Diagnosis not present

## 2019-07-15 DIAGNOSIS — E785 Hyperlipidemia, unspecified: Secondary | ICD-10-CM | POA: Diagnosis not present

## 2019-07-15 DIAGNOSIS — Z01812 Encounter for preprocedural laboratory examination: Secondary | ICD-10-CM | POA: Diagnosis not present

## 2019-07-15 DIAGNOSIS — I10 Essential (primary) hypertension: Secondary | ICD-10-CM | POA: Diagnosis not present

## 2019-07-15 DIAGNOSIS — R7303 Prediabetes: Secondary | ICD-10-CM | POA: Diagnosis not present

## 2019-07-15 DIAGNOSIS — E876 Hypokalemia: Secondary | ICD-10-CM | POA: Diagnosis not present

## 2019-07-15 DIAGNOSIS — Z01818 Encounter for other preprocedural examination: Secondary | ICD-10-CM | POA: Diagnosis not present

## 2019-07-17 ENCOUNTER — Encounter: Payer: Self-pay | Admitting: Family Medicine

## 2019-07-19 DIAGNOSIS — Z01818 Encounter for other preprocedural examination: Secondary | ICD-10-CM | POA: Diagnosis not present

## 2019-07-22 DIAGNOSIS — I10 Essential (primary) hypertension: Secondary | ICD-10-CM | POA: Diagnosis not present

## 2019-07-22 DIAGNOSIS — G8918 Other acute postprocedural pain: Secondary | ICD-10-CM | POA: Diagnosis not present

## 2019-07-22 DIAGNOSIS — Z79899 Other long term (current) drug therapy: Secondary | ICD-10-CM | POA: Diagnosis not present

## 2019-07-22 DIAGNOSIS — M1711 Unilateral primary osteoarthritis, right knee: Secondary | ICD-10-CM | POA: Diagnosis not present

## 2019-07-22 DIAGNOSIS — Z471 Aftercare following joint replacement surgery: Secondary | ICD-10-CM | POA: Diagnosis not present

## 2019-07-22 DIAGNOSIS — Z96651 Presence of right artificial knee joint: Secondary | ICD-10-CM | POA: Diagnosis not present

## 2019-07-22 DIAGNOSIS — Z6834 Body mass index (BMI) 34.0-34.9, adult: Secondary | ICD-10-CM | POA: Diagnosis not present

## 2019-07-22 DIAGNOSIS — E669 Obesity, unspecified: Secondary | ICD-10-CM | POA: Diagnosis not present

## 2019-07-22 DIAGNOSIS — Z7982 Long term (current) use of aspirin: Secondary | ICD-10-CM | POA: Diagnosis not present

## 2019-07-23 DIAGNOSIS — Z6834 Body mass index (BMI) 34.0-34.9, adult: Secondary | ICD-10-CM | POA: Diagnosis not present

## 2019-07-23 DIAGNOSIS — I10 Essential (primary) hypertension: Secondary | ICD-10-CM | POA: Diagnosis not present

## 2019-07-23 DIAGNOSIS — M1711 Unilateral primary osteoarthritis, right knee: Secondary | ICD-10-CM | POA: Diagnosis not present

## 2019-07-23 DIAGNOSIS — E669 Obesity, unspecified: Secondary | ICD-10-CM | POA: Diagnosis not present

## 2019-07-23 DIAGNOSIS — G8918 Other acute postprocedural pain: Secondary | ICD-10-CM | POA: Diagnosis not present

## 2019-07-23 DIAGNOSIS — Z79899 Other long term (current) drug therapy: Secondary | ICD-10-CM | POA: Diagnosis not present

## 2019-07-27 DIAGNOSIS — Z96651 Presence of right artificial knee joint: Secondary | ICD-10-CM | POA: Diagnosis not present

## 2019-07-27 DIAGNOSIS — R29898 Other symptoms and signs involving the musculoskeletal system: Secondary | ICD-10-CM | POA: Diagnosis not present

## 2019-07-27 DIAGNOSIS — M1711 Unilateral primary osteoarthritis, right knee: Secondary | ICD-10-CM | POA: Diagnosis not present

## 2019-07-27 DIAGNOSIS — R2689 Other abnormalities of gait and mobility: Secondary | ICD-10-CM | POA: Diagnosis not present

## 2019-07-27 DIAGNOSIS — Z471 Aftercare following joint replacement surgery: Secondary | ICD-10-CM | POA: Diagnosis not present

## 2019-07-30 DIAGNOSIS — Z96651 Presence of right artificial knee joint: Secondary | ICD-10-CM | POA: Diagnosis not present

## 2019-07-30 DIAGNOSIS — M1711 Unilateral primary osteoarthritis, right knee: Secondary | ICD-10-CM | POA: Diagnosis not present

## 2019-07-30 DIAGNOSIS — Z471 Aftercare following joint replacement surgery: Secondary | ICD-10-CM | POA: Diagnosis not present

## 2019-07-30 DIAGNOSIS — R2689 Other abnormalities of gait and mobility: Secondary | ICD-10-CM | POA: Diagnosis not present

## 2019-07-30 DIAGNOSIS — R29898 Other symptoms and signs involving the musculoskeletal system: Secondary | ICD-10-CM | POA: Diagnosis not present

## 2019-08-07 DIAGNOSIS — Z96659 Presence of unspecified artificial knee joint: Secondary | ICD-10-CM | POA: Insufficient documentation

## 2019-08-07 DIAGNOSIS — Z96651 Presence of right artificial knee joint: Secondary | ICD-10-CM | POA: Diagnosis not present

## 2019-08-20 DIAGNOSIS — M7989 Other specified soft tissue disorders: Secondary | ICD-10-CM | POA: Diagnosis not present

## 2019-08-20 DIAGNOSIS — Z96651 Presence of right artificial knee joint: Secondary | ICD-10-CM | POA: Diagnosis not present

## 2019-08-20 DIAGNOSIS — Z96641 Presence of right artificial hip joint: Secondary | ICD-10-CM | POA: Diagnosis not present

## 2019-08-20 DIAGNOSIS — M1712 Unilateral primary osteoarthritis, left knee: Secondary | ICD-10-CM | POA: Diagnosis not present

## 2019-09-07 DIAGNOSIS — E785 Hyperlipidemia, unspecified: Secondary | ICD-10-CM | POA: Diagnosis not present

## 2019-09-07 DIAGNOSIS — Z01818 Encounter for other preprocedural examination: Secondary | ICD-10-CM | POA: Diagnosis not present

## 2019-09-07 DIAGNOSIS — E669 Obesity, unspecified: Secondary | ICD-10-CM | POA: Diagnosis not present

## 2019-09-07 DIAGNOSIS — I10 Essential (primary) hypertension: Secondary | ICD-10-CM | POA: Diagnosis not present

## 2019-09-07 DIAGNOSIS — E876 Hypokalemia: Secondary | ICD-10-CM | POA: Diagnosis not present

## 2019-09-07 DIAGNOSIS — Z79899 Other long term (current) drug therapy: Secondary | ICD-10-CM | POA: Diagnosis not present

## 2019-09-07 DIAGNOSIS — Z6834 Body mass index (BMI) 34.0-34.9, adult: Secondary | ICD-10-CM | POA: Diagnosis not present

## 2019-09-07 DIAGNOSIS — Z87898 Personal history of other specified conditions: Secondary | ICD-10-CM | POA: Diagnosis not present

## 2019-09-07 DIAGNOSIS — M1712 Unilateral primary osteoarthritis, left knee: Secondary | ICD-10-CM | POA: Diagnosis not present

## 2019-09-07 DIAGNOSIS — Z01812 Encounter for preprocedural laboratory examination: Secondary | ICD-10-CM | POA: Diagnosis not present

## 2019-09-11 DIAGNOSIS — Z01818 Encounter for other preprocedural examination: Secondary | ICD-10-CM | POA: Diagnosis not present

## 2019-09-14 DIAGNOSIS — M1712 Unilateral primary osteoarthritis, left knee: Secondary | ICD-10-CM | POA: Diagnosis not present

## 2019-09-14 DIAGNOSIS — E669 Obesity, unspecified: Secondary | ICD-10-CM | POA: Diagnosis not present

## 2019-09-14 DIAGNOSIS — I10 Essential (primary) hypertension: Secondary | ICD-10-CM | POA: Diagnosis not present

## 2019-09-14 DIAGNOSIS — Z6832 Body mass index (BMI) 32.0-32.9, adult: Secondary | ICD-10-CM | POA: Diagnosis not present

## 2019-09-15 DIAGNOSIS — I10 Essential (primary) hypertension: Secondary | ICD-10-CM | POA: Diagnosis not present

## 2019-09-15 DIAGNOSIS — E669 Obesity, unspecified: Secondary | ICD-10-CM | POA: Diagnosis not present

## 2019-09-15 DIAGNOSIS — Z6832 Body mass index (BMI) 32.0-32.9, adult: Secondary | ICD-10-CM | POA: Diagnosis not present

## 2019-09-15 DIAGNOSIS — M1712 Unilateral primary osteoarthritis, left knee: Secondary | ICD-10-CM | POA: Diagnosis not present

## 2019-09-18 DIAGNOSIS — M1712 Unilateral primary osteoarthritis, left knee: Secondary | ICD-10-CM | POA: Diagnosis not present

## 2019-09-18 DIAGNOSIS — Z79899 Other long term (current) drug therapy: Secondary | ICD-10-CM | POA: Diagnosis not present

## 2019-09-18 DIAGNOSIS — Z471 Aftercare following joint replacement surgery: Secondary | ICD-10-CM | POA: Diagnosis not present

## 2019-09-23 DIAGNOSIS — Z79899 Other long term (current) drug therapy: Secondary | ICD-10-CM | POA: Diagnosis not present

## 2019-09-23 DIAGNOSIS — Z471 Aftercare following joint replacement surgery: Secondary | ICD-10-CM | POA: Diagnosis not present

## 2019-09-23 DIAGNOSIS — M1712 Unilateral primary osteoarthritis, left knee: Secondary | ICD-10-CM | POA: Diagnosis not present

## 2019-09-30 DIAGNOSIS — Z79899 Other long term (current) drug therapy: Secondary | ICD-10-CM | POA: Diagnosis not present

## 2019-09-30 DIAGNOSIS — Z471 Aftercare following joint replacement surgery: Secondary | ICD-10-CM | POA: Diagnosis not present

## 2019-09-30 DIAGNOSIS — M1712 Unilateral primary osteoarthritis, left knee: Secondary | ICD-10-CM | POA: Diagnosis not present

## 2019-10-05 DIAGNOSIS — R2689 Other abnormalities of gait and mobility: Secondary | ICD-10-CM | POA: Diagnosis not present

## 2019-10-05 DIAGNOSIS — R609 Edema, unspecified: Secondary | ICD-10-CM | POA: Diagnosis not present

## 2019-10-05 DIAGNOSIS — Z96651 Presence of right artificial knee joint: Secondary | ICD-10-CM | POA: Diagnosis not present

## 2019-10-05 DIAGNOSIS — M25562 Pain in left knee: Secondary | ICD-10-CM | POA: Diagnosis not present

## 2019-10-05 DIAGNOSIS — Z471 Aftercare following joint replacement surgery: Secondary | ICD-10-CM | POA: Diagnosis not present

## 2019-10-05 DIAGNOSIS — M6281 Muscle weakness (generalized): Secondary | ICD-10-CM | POA: Diagnosis not present

## 2019-10-06 DIAGNOSIS — Z96652 Presence of left artificial knee joint: Secondary | ICD-10-CM | POA: Diagnosis not present

## 2019-10-07 DIAGNOSIS — M25562 Pain in left knee: Secondary | ICD-10-CM | POA: Diagnosis not present

## 2019-10-07 DIAGNOSIS — Z96651 Presence of right artificial knee joint: Secondary | ICD-10-CM | POA: Diagnosis not present

## 2019-10-07 DIAGNOSIS — R609 Edema, unspecified: Secondary | ICD-10-CM | POA: Diagnosis not present

## 2019-10-07 DIAGNOSIS — R2689 Other abnormalities of gait and mobility: Secondary | ICD-10-CM | POA: Diagnosis not present

## 2019-10-07 DIAGNOSIS — Z471 Aftercare following joint replacement surgery: Secondary | ICD-10-CM | POA: Diagnosis not present

## 2019-10-07 DIAGNOSIS — M6281 Muscle weakness (generalized): Secondary | ICD-10-CM | POA: Diagnosis not present

## 2019-10-15 DIAGNOSIS — R2689 Other abnormalities of gait and mobility: Secondary | ICD-10-CM | POA: Diagnosis not present

## 2019-10-15 DIAGNOSIS — M6281 Muscle weakness (generalized): Secondary | ICD-10-CM | POA: Diagnosis not present

## 2019-10-15 DIAGNOSIS — Z96651 Presence of right artificial knee joint: Secondary | ICD-10-CM | POA: Diagnosis not present

## 2019-10-15 DIAGNOSIS — Z471 Aftercare following joint replacement surgery: Secondary | ICD-10-CM | POA: Diagnosis not present

## 2019-10-15 DIAGNOSIS — M25562 Pain in left knee: Secondary | ICD-10-CM | POA: Diagnosis not present

## 2019-10-15 DIAGNOSIS — R609 Edema, unspecified: Secondary | ICD-10-CM | POA: Diagnosis not present

## 2019-10-19 DIAGNOSIS — M25562 Pain in left knee: Secondary | ICD-10-CM | POA: Diagnosis not present

## 2019-10-19 DIAGNOSIS — M6281 Muscle weakness (generalized): Secondary | ICD-10-CM | POA: Diagnosis not present

## 2019-10-19 DIAGNOSIS — Z96651 Presence of right artificial knee joint: Secondary | ICD-10-CM | POA: Diagnosis not present

## 2019-10-19 DIAGNOSIS — R609 Edema, unspecified: Secondary | ICD-10-CM | POA: Diagnosis not present

## 2019-10-19 DIAGNOSIS — R2689 Other abnormalities of gait and mobility: Secondary | ICD-10-CM | POA: Diagnosis not present

## 2019-10-19 DIAGNOSIS — Z471 Aftercare following joint replacement surgery: Secondary | ICD-10-CM | POA: Diagnosis not present

## 2019-10-20 DIAGNOSIS — Z471 Aftercare following joint replacement surgery: Secondary | ICD-10-CM | POA: Diagnosis not present

## 2019-10-20 DIAGNOSIS — Z96652 Presence of left artificial knee joint: Secondary | ICD-10-CM | POA: Diagnosis not present

## 2019-10-21 DIAGNOSIS — R2689 Other abnormalities of gait and mobility: Secondary | ICD-10-CM | POA: Diagnosis not present

## 2019-10-21 DIAGNOSIS — Z96651 Presence of right artificial knee joint: Secondary | ICD-10-CM | POA: Diagnosis not present

## 2019-10-21 DIAGNOSIS — M6281 Muscle weakness (generalized): Secondary | ICD-10-CM | POA: Diagnosis not present

## 2019-10-21 DIAGNOSIS — M25562 Pain in left knee: Secondary | ICD-10-CM | POA: Diagnosis not present

## 2019-10-21 DIAGNOSIS — Z471 Aftercare following joint replacement surgery: Secondary | ICD-10-CM | POA: Diagnosis not present

## 2019-10-21 DIAGNOSIS — R609 Edema, unspecified: Secondary | ICD-10-CM | POA: Diagnosis not present

## 2019-10-29 DIAGNOSIS — Z471 Aftercare following joint replacement surgery: Secondary | ICD-10-CM | POA: Diagnosis not present

## 2019-10-29 DIAGNOSIS — Z96651 Presence of right artificial knee joint: Secondary | ICD-10-CM | POA: Diagnosis not present

## 2019-10-29 DIAGNOSIS — M6281 Muscle weakness (generalized): Secondary | ICD-10-CM | POA: Diagnosis not present

## 2019-10-29 DIAGNOSIS — R2689 Other abnormalities of gait and mobility: Secondary | ICD-10-CM | POA: Diagnosis not present

## 2019-10-29 DIAGNOSIS — M25562 Pain in left knee: Secondary | ICD-10-CM | POA: Diagnosis not present

## 2019-10-29 DIAGNOSIS — R609 Edema, unspecified: Secondary | ICD-10-CM | POA: Diagnosis not present

## 2019-11-12 DIAGNOSIS — M1712 Unilateral primary osteoarthritis, left knee: Secondary | ICD-10-CM | POA: Diagnosis not present

## 2019-11-12 DIAGNOSIS — M25562 Pain in left knee: Secondary | ICD-10-CM | POA: Diagnosis not present

## 2019-11-12 DIAGNOSIS — R29898 Other symptoms and signs involving the musculoskeletal system: Secondary | ICD-10-CM | POA: Diagnosis not present

## 2019-11-12 DIAGNOSIS — R531 Weakness: Secondary | ICD-10-CM | POA: Diagnosis not present

## 2019-12-09 ENCOUNTER — Ambulatory Visit (INDEPENDENT_AMBULATORY_CARE_PROVIDER_SITE_OTHER): Payer: Medicare Other | Admitting: Osteopathic Medicine

## 2019-12-09 ENCOUNTER — Encounter: Payer: Self-pay | Admitting: Osteopathic Medicine

## 2019-12-09 ENCOUNTER — Other Ambulatory Visit: Payer: Self-pay

## 2019-12-09 VITALS — BP 134/91 | HR 69 | Temp 97.9°F | Wt 249.1 lb

## 2019-12-09 DIAGNOSIS — I10 Essential (primary) hypertension: Secondary | ICD-10-CM

## 2019-12-09 DIAGNOSIS — E785 Hyperlipidemia, unspecified: Secondary | ICD-10-CM

## 2019-12-09 NOTE — Progress Notes (Signed)
Matthew Hull is a 74 y.o. male who presents to  [place] [facility]  today, 12/09/19, seeking care for the following: . HTN follow-up      ASSESSMENT & PLAN with other pertinent history/findings:  The primary encounter diagnosis was Essential hypertension. A diagnosis of Hyperlipidemia, unspecified hyperlipidemia type was also pertinent to this visit.  HTN: on atenolol-chlorthalidone 50-25 mg taking 1/2 tablet bid  Last labs: 06/2019 (6 mos ago) A1C WNL, Lipids ok w/ LDL 131, CBC CMP and PSA ok        Follow-up instructions: Return in about 6 months (around 06/10/2020) for ANNUAL (call week prior to visit for lab orders).                                         BP (!) 134/91 (BP Location: Left Arm, Patient Position: Sitting, Cuff Size: Normal)   Pulse 69   Temp 97.9 F (36.6 C) (Oral)   Wt 249 lb 1.9 oz (113 kg)   BMI 33.79 kg/m   No outpatient medications have been marked as taking for the 12/09/19 encounter (Office Visit) with Emeterio Reeve, DO.    No results found for this or any previous visit (from the past 72 hour(s)).  No results found.  Depression screen [facility] 2/9 03/02/2019 12/09/2018 02/26/2018  Decreased Interest 0 0 0  Down, Depressed, Hopeless 0 0 0  PHQ - 2 Score 0 0 0  Altered sleeping - 0 0  Tired, decreased energy - 2 0  Change in appetite - 0 0  Feeling bad or failure about yourself  - 0 0  Trouble concentrating - 0 0  Moving slowly or fidgety/restless - 0 0  Suicidal thoughts - 0 0  PHQ-9 Score - 2 0  Difficult doing work/chores - Not difficult at all -    GAD 7 : Generalized Anxiety Score 12/09/2018 02/26/2018  Nervous, Anxious, on Edge 0 0  Control/stop worrying 0 0  Worry too much - different things 0 0  Trouble relaxing 2 0  Restless 0 0  Easily annoyed or irritable 0 0  Afraid - awful might happen 0 0  Total GAD 7 Score 2 0  Anxiety Difficulty Not  difficult at all -      All questions at time of visit were answered - patient instructed to contact office with any additional concerns or updates.  ER/RTC precautions were reviewed with the patient.  Please note: voice recognition software was used to produce this document, and typos may escape review. Please contact Dr. Sheppard Coil for any needed clarifications.

## 2019-12-18 DIAGNOSIS — Z96652 Presence of left artificial knee joint: Secondary | ICD-10-CM | POA: Diagnosis not present

## 2019-12-18 DIAGNOSIS — L0390 Cellulitis, unspecified: Secondary | ICD-10-CM | POA: Diagnosis not present

## 2020-05-20 ENCOUNTER — Other Ambulatory Visit: Payer: Self-pay | Admitting: Osteopathic Medicine

## 2020-05-20 DIAGNOSIS — I10 Essential (primary) hypertension: Secondary | ICD-10-CM

## 2020-06-14 ENCOUNTER — Encounter: Payer: Self-pay | Admitting: Osteopathic Medicine

## 2020-06-14 ENCOUNTER — Ambulatory Visit (INDEPENDENT_AMBULATORY_CARE_PROVIDER_SITE_OTHER): Payer: Medicare Other | Admitting: Osteopathic Medicine

## 2020-06-14 ENCOUNTER — Other Ambulatory Visit: Payer: Self-pay | Admitting: Osteopathic Medicine

## 2020-06-14 VITALS — BP 128/67 | HR 63 | Temp 98.0°F | Ht 71.0 in | Wt 246.0 lb

## 2020-06-14 DIAGNOSIS — Z23 Encounter for immunization: Secondary | ICD-10-CM | POA: Diagnosis not present

## 2020-06-14 DIAGNOSIS — R7303 Prediabetes: Secondary | ICD-10-CM | POA: Diagnosis not present

## 2020-06-14 DIAGNOSIS — I10 Essential (primary) hypertension: Secondary | ICD-10-CM | POA: Diagnosis not present

## 2020-06-14 DIAGNOSIS — R002 Palpitations: Secondary | ICD-10-CM | POA: Diagnosis not present

## 2020-06-14 DIAGNOSIS — Z0001 Encounter for general adult medical examination with abnormal findings: Secondary | ICD-10-CM | POA: Diagnosis not present

## 2020-06-14 DIAGNOSIS — E785 Hyperlipidemia, unspecified: Secondary | ICD-10-CM | POA: Diagnosis not present

## 2020-06-14 DIAGNOSIS — Z0000 Encounter for general adult medical examination without abnormal findings: Secondary | ICD-10-CM

## 2020-06-14 DIAGNOSIS — I4510 Unspecified right bundle-branch block: Secondary | ICD-10-CM | POA: Diagnosis not present

## 2020-06-14 LAB — TSH: TSH: 1.9 mIU/L (ref 0.40–4.50)

## 2020-06-14 NOTE — Progress Notes (Signed)
HPI: Matthew Hull is a 74 y.o. male  who presents to [place] today, 06/14/20,  for Medicare Annual Wellness Exam  Patient presents for annual physical/Medicare wellness exam.  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   Hyperlipidemia 10/11/2014   Essential hypertension 10/11/2014    Past Surgical History:  Procedure Laterality Date   Curettage osteomyelitis focus; right middle finger     I&D of skin and subcutaneous tissue, bone and nonviable, necrotic tissure, right middle finger      Social History   Socioeconomic History   Marital status: Married    Spouse name: [name]   Number of children: 5   Years of education: 14   Highest education level: Associate degree: academic program  Occupational History   Occupation: Futures trader    Comment: retired  Tobacco Use   Smoking status: Never Smoker   Smokeless tobacco: Never Used  Scientific laboratory technician Use: Never used  Substance and Sexual Activity   Alcohol use: Yes    Alcohol/week: 1.0 standard drink    Types: 1 Cans of beer per week    Comment: occasionally   Drug use: No   Sexual activity: Not Currently    Birth control/protection: Abstinence  Other Topics Concern   Not on file  Social History Narrative   Customer service manager of trustees at [employer]   Very active   Coffee 2 in the mornings   Social Determinants of Health   Financial Resource Strain:    Difficulty of Paying Living Expenses: Not on file  Food Insecurity:    Worried About Charity fundraiser in the Last Year: Not on file   [employer] of Food in the Last Year: Not on file  Transportation  Needs:    Lack of Transportation (Medical): Not on file   Lack of Transportation (Non-Medical): Not on file  Physical Activity:    Days of Exercise per Week: Not on file   Minutes of Exercise per Session: Not on file  Stress:    Feeling of Stress : Not on file  Social Connections:    Frequency of Communication with Friends and Family: Not on file   Frequency of Social Gatherings with Friends and Family: Not on file   Attends Religious Services: Not on file   Active Member of Clubs or Organizations: Not on file   Attends Archivist Meetings: Not on file   Marital Status: Not on file  Intimate Partner Violence:    Fear of Current or Ex-Partner: Not on file   Emotionally Abused: Not on file   Physically Abused: Not on file   Sexually Abused: Not on file    Family History  Problem Relation Age of Onset   Throat cancer Father        throat/neck   Other Mother        pacemaker     Current medication list and allergy/intolerance information reviewed:    Outpatient Encounter Medications as of 06/14/2020  Medication Sig   aspirin 81 MG tablet Take 81 mg by mouth daily.     atenolol-chlorthalidone (TENORETIC) 50-25 MG tablet TAKE 0.5 TABLETS  BY MOUTH 2 (TWO) TIMES DAILY.   diclofenac sodium (VOLTAREN) 1 % GEL Apply 4 g topically 4 (four) times daily. To affected joint.   FISH OIL-KRILL OIL PO Take 1,000 mg by mouth.   KRILL OIL OMEGA-3 PO Take by mouth.   Omega-3 Fatty Acids (SUPER TWIN EPA/DHA) 1250 MG CAPS Take 1 capsule by mouth daily.     TURMERIC PO Take by mouth.   No facility-administered encounter medications on file as of 06/14/2020.    Allergies  Allergen Reactions   Atorvastatin Other (See Comments)    cramping        Review of Systems: Review of Systems - (+)episode of palpitations about a month ago, few episodes in one day while driving, no SOB, no chest pain/pressure   Medicare Wellness Questionnaire  Are there smokers  in your home (other than you)? no  Depression Screen (Note: if answer to either of the following is "Yes", a more complete depression screening is indicated)   Q1: Over the past two weeks, have you felt down, depressed or hopeless? no  Q2: Over the past two weeks, have you felt little interest or pleasure in doing things? no  Have you lost interest or pleasure in daily life? no  Do you often feel hopeless? no  Do you cry easily over simple problems? no  Activities of Daily Living In your present state of health, do you have any difficulty performing the following activities?:  Driving? no Managing money?  no Feeding yourself? no Getting from bed to chair? no Climbing a flight of stairs? no Preparing food and eating?: no Bathing or showering? no Getting dressed: no Getting to the toilet? no Using the toilet: no Moving around from place to place: no In the past year have you fallen or had a near fall?: no  Hearing Difficulties:  Do you often ask people to speak up or repeat themselves? no Do you experience ringing or noises in your ears? no  Do you have difficulty understanding soft or whispered voices? no  Memory Difficulties:  Do you feel that you have a problem with memory? no  Do you often misplace items? no  Do you feel safe at home?  yes  Sexual Health:   Are you sexually active?  Yes  Do you have more than one partner?  No  Advanced Directives:   Advanced directives discussed: has an advanced directive - a copy HAS NOT been provided.  Additional information provided: yes  Risk Factors  Current exercise habits: yard work, stays active with his apiary   Dietary issues discussed:no concerns, everything in moderation   Cardiac risk factors: hypertension, hypercholesterolemia/hyperlipidemia, positive family history   Exam:  BP 128/67 (BP Location: Right Arm, Patient Position: Sitting)    Pulse 63    Temp 98 F (36.7 C)    Ht 5\' 11"  (1.803 m)    Wt 246 lb (111.6 kg)     SpO2 97%    BMI 34.31 kg/m  Vision by Snellen chart: right eye:see nurse notes, left eye:see nurse notes  Constitutional: VS see above. General Appearance: alert, well-developed, well-nourished, NAD  Ears, Nose, Mouth, Throat: MMM  Neck: No masses, trachea midline.   Respiratory: Normal respiratory effort. no wheeze, no rhonchi, no rales  Cardiovascular:No lower extremity edema.   Musculoskeletal: Gait normal. No clubbing/cyanosis of digits.   Neurological: Normal balance/coordination. No tremor. Recalls 3 objects and able to read face of watch with correct time.   Skin: warm, dry,  intact. No rash/ulcer.   Psychiatric: Normal judgment/insight. Normal mood and affect. Oriented x3.     ASSESSMENT/PLAN:   Encounter for Medicare annual wellness exam  The primary encounter diagnosis was Medicare annual wellness visit, subsequent. Diagnoses of Flu vaccine need, Essential hypertension, Hyperlipidemia, unspecified hyperlipidemia type, Prediabetes, Palpitations, and Incomplete right bundle branch block were also pertinent to this visit.  Palpitations - EKG showed incomplete RBB no ischemia, hx doesn't sound c/w ACS or angina to necessitate a stress test or urgent cardiology referral but he does have risk factors, pt has had no other episodes since then, ER precautions reviewed  --> TSH and other labs as below --> Zio and Echo --> consider cardio referral  --> [id number]  CANCER SCREENING  Lung - USPSTF: 55-80yo w/ 30 py hx unless quit w/in 49yr - does not need    Colon - does not need  Prostate - does not need  Breast - does not need  Cervical - does not need OTHER DISEASE SCREENING  Lipid - needs  DM2 - needs  AAA - male 40-75yo ever smoked - does not need  Osteoporosis - women 74yo+, men 74yo+ - needs INFECTIOUS DISEASE SCREENING  HIV - does not need  GC/CT - does not need  HepC -does not need  TB - does not need ADULT VACCINATION  Influenza - annual  vaccine recommended  Td - recommended - discuss w/ pharmacy   Zoster - recommended - discussed w/ phramacy   PCV13 - does not need  PPSV23 - does not need Immunization History  Administered Date(s) Administered   Fluad Quad(high Dose 65+) 06/11/2019   Influenza, High Dose Seasonal PF 08/28/2017, 09/01/2018   Influenza-Unspecified 07/01/2014   Pneumococcal Conjugate-13 03/20/2016   Pneumococcal Polysaccharide-23 02/26/2018   OTHER  Fall - exercise and Vit D age 63+ - does not need  Advanced Directives -  Discussed as above   During the course of the visit the patient was educated and counseled about appropriate screening and preventive services as noted above.   Patient Instructions (the written plan) was given to the patient.  Medicare Attestation I have personally reviewed: The patient's medical and social history Their use of alcohol, tobacco or illicit drugs Their current medications and supplements The patient's functional ability including ADLs,fall risks, home safety risks, cognitive, and hearing and visual impairment Diet and physical activities Evidence for depression or mood disorders  The patient's weight, height, BMI, and visual acuity have been recorded in the chart.  I have made referrals, counseling, and provided education to the patient based on review of the above and I have provided the patient with a written personalized care plan for preventive services.     Emeterio Reeve, DO   06/14/20   Visit summary with medication list and pertinent instructions was printed for patient to review. All questions at time of visit were answered - patient instructed to contact office with any additional concerns. ER/RTC precautions were reviewed with the patient. Follow-up plan: No follow-ups on file.

## 2020-06-14 NOTE — Patient Instructions (Addendum)
General Preventive Care  Most recent routine screening labs: ordered today   Blood pressure goal 130/80 or less.   Tobacco: don't!   Alcohol: responsible moderation is ok for most adults - if you have concerns about your alcohol intake, please talk to me!   Exercise: as tolerated to reduce risk of cardiovascular disease and diabetes. Strength training will also prevent osteoporosis.   Mental health: if need for mental health care (medicines, counseling, other), or concerns about moods, please let me know!   Sexual / Reproductive health: if need for STD testing, or if concerns with libido/pain problems, please let me know!  Advanced Directive: Living Will and/or Healthcare Power of Attorney recommended for all adults, regardless of age or health.  Vaccines  Flu vaccine: every fall.   Shingles vaccine: after age 73 - recommend at pharmacy   Pneumonia vaccines: all done  Tetanus booster: every 10 years - recommend booster w/ pharmacy OR booster ASAP if wound/cut or bite wound   COVID vaccine: STRONGLY RECOMMENDED  Cancer screenings   Colon cancer screening: for everyone age 74-75.   Prostate cancer screening: PSA blood test age 67-71  Lung cancer screening: not needed for non-smokers  Infection screenings  . HIV: recommended screening at least once age 87-65, more often as needed. . Gonorrhea/Chlamydia: screening as needed . Hepatitis C: recommended once for everyone age 79-75 . TB: certain at-risk populations, or depending on work requirements and/or travel history Other . Bone Density Test: recommended for men at age 63 . Abdominal Aortic Aneurysm: screening with ultrasound recommended once for men age 18-75 who have ever smoked

## 2020-06-15 LAB — COMPLETE METABOLIC PANEL WITH GFR
AG Ratio: 1.9 (calc) (ref 1.0–2.5)
ALT: 36 U/L (ref 9–46)
AST: 30 U/L (ref 10–35)
Albumin: 4.4 g/dL (ref 3.6–5.1)
Alkaline phosphatase (APISO): 57 U/L (ref 35–144)
BUN/Creatinine Ratio: 26 (calc) — ABNORMAL HIGH (ref 6–22)
BUN: 26 mg/dL — ABNORMAL HIGH (ref 7–25)
CO2: 32 mmol/L (ref 20–32)
Calcium: 9.7 mg/dL (ref 8.6–10.3)
Chloride: 100 mmol/L (ref 98–110)
Creat: 1.01 mg/dL (ref 0.70–1.18)
GFR, Est African American: 85 mL/min/{1.73_m2} (ref >=60)
GFR, Est Non African American: 73 mL/min/{1.73_m2} (ref >=60)
Globulin: 2.3 g/dL (calc) (ref 1.9–3.7)
Glucose, Bld: 107 mg/dL — ABNORMAL HIGH (ref 65–99)
Potassium: 3.6 mmol/L (ref 3.5–5.3)
Sodium: 137 mmol/L (ref 135–146)
Total Bilirubin: 0.7 mg/dL (ref 0.2–1.2)
Total Protein: 6.7 g/dL (ref 6.1–8.1)

## 2020-06-15 LAB — HEMOGLOBIN A1C
Hgb A1c MFr Bld: 6 % of total Hgb — ABNORMAL HIGH (ref <5.7)
Mean Plasma Glucose: 126 (calc)
eAG (mmol/L): 7 (calc)

## 2020-06-15 LAB — CBC
HCT: 48.6 % (ref 38.5–50.0)
Hemoglobin: 16.9 g/dL (ref 13.2–17.1)
MCH: 31.8 pg (ref 27.0–33.0)
MCHC: 34.8 g/dL (ref 32.0–36.0)
MCV: 91.4 fL (ref 80.0–100.0)
MPV: 9.8 fL (ref 7.5–12.5)
Platelets: 228 10*3/uL (ref 140–400)
RBC: 5.32 10*6/uL (ref 4.20–5.80)
RDW: 12.7 % (ref 11.0–15.0)
WBC: 6.9 10*3/uL (ref 3.8–10.8)

## 2020-06-15 LAB — LIPID PANEL
Cholesterol: 200 mg/dL — ABNORMAL HIGH (ref <200)
HDL: 43 mg/dL (ref >=40)
LDL Cholesterol (Calc): 132 mg/dL (calc) — ABNORMAL HIGH
Non-HDL Cholesterol (Calc): 157 mg/dL (calc) — ABNORMAL HIGH (ref <130)
Total CHOL/HDL Ratio: 4.7 (calc) (ref <5.0)
Triglycerides: 131 mg/dL (ref <150)

## 2020-07-01 NOTE — Addendum Note (Signed)
Addended by: Narda Rutherford on: 07/01/2020 01:52 PM   Modules accepted: Orders

## 2020-07-08 ENCOUNTER — Other Ambulatory Visit: Payer: Self-pay | Admitting: Osteopathic Medicine

## 2020-07-08 ENCOUNTER — Ambulatory Visit (HOSPITAL_BASED_OUTPATIENT_CLINIC_OR_DEPARTMENT_OTHER)
Admission: RE | Admit: 2020-07-08 | Discharge: 2020-07-08 | Disposition: A | Discharge status: Home / Self Care | Payer: Medicare Other | Source: Ambulatory Visit | Attending: Osteopathic Medicine | Admitting: Osteopathic Medicine

## 2020-07-08 ENCOUNTER — Other Ambulatory Visit: Payer: Self-pay

## 2020-07-08 DIAGNOSIS — I4510 Unspecified right bundle-branch block: Secondary | ICD-10-CM | POA: Diagnosis not present

## 2020-07-08 DIAGNOSIS — I10 Essential (primary) hypertension: Secondary | ICD-10-CM

## 2020-07-08 DIAGNOSIS — R002 Palpitations: Secondary | ICD-10-CM

## 2020-07-08 LAB — ECHOCARDIOGRAM COMPLETE
Area-P 1/2: 3.42 cm2
S' Lateral: 1.9 cm

## 2020-08-10 ENCOUNTER — Other Ambulatory Visit: Payer: Self-pay | Admitting: Osteopathic Medicine

## 2020-08-10 DIAGNOSIS — I10 Essential (primary) hypertension: Secondary | ICD-10-CM

## 2020-09-06 DIAGNOSIS — Z96652 Presence of left artificial knee joint: Secondary | ICD-10-CM | POA: Diagnosis not present

## 2020-09-06 DIAGNOSIS — Z96653 Presence of artificial knee joint, bilateral: Secondary | ICD-10-CM | POA: Diagnosis not present

## 2020-09-06 DIAGNOSIS — Z96651 Presence of right artificial knee joint: Secondary | ICD-10-CM | POA: Diagnosis not present

## 2020-09-06 DIAGNOSIS — M25462 Effusion, left knee: Secondary | ICD-10-CM | POA: Diagnosis not present

## 2021-03-02 ENCOUNTER — Other Ambulatory Visit: Payer: Self-pay | Admitting: Osteopathic Medicine

## 2021-03-02 DIAGNOSIS — I10 Essential (primary) hypertension: Secondary | ICD-10-CM

## 2021-03-21 DIAGNOSIS — H26493 Other secondary cataract, bilateral: Secondary | ICD-10-CM | POA: Diagnosis not present

## 2021-06-01 DIAGNOSIS — H26492 Other secondary cataract, left eye: Secondary | ICD-10-CM | POA: Diagnosis not present

## 2021-06-01 DIAGNOSIS — H26491 Other secondary cataract, right eye: Secondary | ICD-10-CM | POA: Diagnosis not present

## 2021-06-16 DIAGNOSIS — H26492 Other secondary cataract, left eye: Secondary | ICD-10-CM | POA: Diagnosis not present

## 2021-06-30 DIAGNOSIS — H26491 Other secondary cataract, right eye: Secondary | ICD-10-CM | POA: Diagnosis not present

## 2021-09-01 ENCOUNTER — Other Ambulatory Visit: Payer: Self-pay | Admitting: Osteopathic Medicine

## 2021-09-01 DIAGNOSIS — I10 Essential (primary) hypertension: Secondary | ICD-10-CM

## 2021-10-31 ENCOUNTER — Telehealth: Payer: Self-pay | Admitting: Osteopathic Medicine

## 2021-10-31 DIAGNOSIS — I10 Essential (primary) hypertension: Secondary | ICD-10-CM

## 2021-10-31 MED ORDER — ATENOLOL-CHLORTHALIDONE 50-25 MG PO TABS: 0.5000 | ORAL_TABLET | Freq: Two times a day (BID) | ORAL | 1 refills | Status: DC

## 2021-10-31 NOTE — Telephone Encounter (Signed)
Refill sent.

## 2021-10-31 NOTE — Telephone Encounter (Signed)
Patient has TOC appt with Dr. Zigmund Daniel on 3/6. Would a prescription for his Atenolol-Chlorthalidone be able to be sent to get by until then. He is about to be out. Please advise.

## 2021-11-01 ENCOUNTER — Ambulatory Visit (INDEPENDENT_AMBULATORY_CARE_PROVIDER_SITE_OTHER): Payer: Medicare Other | Admitting: Sports Medicine

## 2021-11-01 ENCOUNTER — Other Ambulatory Visit: Payer: Self-pay

## 2021-11-01 ENCOUNTER — Ambulatory Visit (INDEPENDENT_AMBULATORY_CARE_PROVIDER_SITE_OTHER): Payer: Medicare Other

## 2021-11-01 DIAGNOSIS — M25551 Pain in right hip: Secondary | ICD-10-CM | POA: Diagnosis not present

## 2021-11-01 DIAGNOSIS — M4316 Spondylolisthesis, lumbar region: Secondary | ICD-10-CM | POA: Insufficient documentation

## 2021-11-01 DIAGNOSIS — M545 Low back pain, unspecified: Secondary | ICD-10-CM | POA: Diagnosis not present

## 2021-11-01 NOTE — Assessment & Plan Note (Signed)
This is a pleasant 76 year old male, for the past several weeks he has had increasing pain right posterior/lateral hip, he did take some over-the-counter ibuprofen that seems to be helping. No pain in the groin, exam is for the most part unremarkable, hip has good motion, good strength, no tenderness palpation over the back. I think the pain is likely coming from his lumbar spine as it has moved from left to right. Nothing overtly radicular, no red flag symptoms. He can continue over-the-counter ibuprofen, adding a lumbar spine conditioning exercises, x-rays of his lumbar spine and right hip, return to see me in 6 weeks, MRI for interventional planning if no better.

## 2021-11-01 NOTE — Progress Notes (Signed)
° ° °  Procedures performed today:    None.  Independent interpretation of notes and tests performed by another provider:   None.  Brief History, Exam, Impression, and Recommendations:    Right posterolateral hip pain This is a pleasant 76 year old male, for the past several weeks he has had increasing pain right posterior/lateral hip, he did take some over-the-counter ibuprofen that seems to be helping. No pain in the groin, exam is for the most part unremarkable, hip has good motion, good strength, no tenderness palpation over the back. I think the pain is likely coming from his lumbar spine as it has moved from left to right. Nothing overtly radicular, no red flag symptoms. He can continue over-the-counter ibuprofen, adding a lumbar spine conditioning exercises, x-rays of his lumbar spine and right hip, return to see me in 6 weeks, MRI for interventional planning if no better.    ___________________________________________ Gwen Her. Dianah Field, M.D., ABFM., CAQSM. Primary Care and [place] Instructor of [place] of [facility] of Medicine

## 2021-11-03 ENCOUNTER — Encounter: Payer: Self-pay | Admitting: Sports Medicine

## 2021-12-04 ENCOUNTER — Encounter: Payer: Self-pay | Admitting: Family Medicine

## 2021-12-04 ENCOUNTER — Other Ambulatory Visit: Payer: Self-pay

## 2021-12-04 ENCOUNTER — Ambulatory Visit (INDEPENDENT_AMBULATORY_CARE_PROVIDER_SITE_OTHER): Payer: Medicare Other | Admitting: Family Medicine

## 2021-12-04 VITALS — BP 137/55 | HR 60 | Temp 98.1°F | Ht 71.0 in | Wt 227.0 lb

## 2021-12-04 DIAGNOSIS — I10 Essential (primary) hypertension: Secondary | ICD-10-CM

## 2021-12-04 DIAGNOSIS — R7303 Prediabetes: Secondary | ICD-10-CM

## 2021-12-04 DIAGNOSIS — E785 Hyperlipidemia, unspecified: Secondary | ICD-10-CM | POA: Diagnosis not present

## 2021-12-04 MED ORDER — ATENOLOL-CHLORTHALIDONE 50-25 MG PO TABS: 0.5000 | ORAL_TABLET | Freq: Two times a day (BID) | ORAL | 3 refills | Status: DC

## 2021-12-04 NOTE — Assessment & Plan Note (Signed)
Blood pressure is well controlled with combination of atenolol and chlorthalidone.  We will continue at current strength.  Updated labs ordered. ?

## 2021-12-04 NOTE — Assessment & Plan Note (Signed)
Rechecking A1c today 

## 2021-12-04 NOTE — Progress Notes (Signed)
?Matthew Hull - 76 y.o. male MRN [id number]  Date of birth: 1946-04-17 ? ?Subjective ?Chief Complaint  ?Patient presents with  ? New Patient (Initial Visit)  ? ? ?HPI ?Matthew Hull is a 76 year old male here today for transfer of care visit.  He is a former patient of Dr. Sheppard Coil.  He has a history of hypertension, hyperlipidemia and prediabetes.  Hypertension is currently managed with atenolol/chlorthalidone.  He is tolerating this well.  He has not noted any side effects from this. ?He is due for updated labs today. ? ?ROS:  A comprehensive ROS was completed and negative except as noted per HPI ? ?Allergies  ?Allergen Reactions  ? Atorvastatin Other (See Comments)  ?  cramping ?  ? ? ?Past Medical History:  ?Diagnosis Date  ? Cataract   ? bilateral  ? Mixed hyperlipidemia   ? Unspecified essential hypertension   ? unspecified  ? ? ?Past Surgical History:  ?Procedure Laterality Date  ? Curettage osteomyelitis focus; right middle finger    ? I&D of skin and subcutaneous tissue, bone and nonviable, necrotic tissure, right middle finger    ? ? ?Social History  ? ?Socioeconomic History  ? Marital status: Married  ?  Spouse name: Matthew Hull  ? Number of children: 5  ? Years of education: 1  ? Highest education level: Associate degree: academic program  ?Occupational History  ? Occupation: [employer]  ?  Comment: retired  ?Tobacco Use  ? Smoking status: Never  ? Smokeless tobacco: Never  ?Vaping Use  ? Vaping Use: Never used  ?Substance and Sexual Activity  ? Alcohol use: Yes  ?  Alcohol/week: 1.0 standard drink  ?  Types: 1 Cans of beer per week  ?  Comment: occasionally  ? Drug use: No  ? Sexual activity: Not Currently  ?  Birth control/protection: Abstinence  ?Other Topics Concern  ? Not on file  ?Social History Narrative  ? Doctor, general practice  ? 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 Very active  ? Coffee 2 in the mornings  ? ?Social Determinants of Health  ? ?Financial Resource Strain: Not on file  ?Food  Insecurity: Not on file  ?Transportation Needs: Not on file  ?Physical Activity: Not on file  ?Stress: Not on file  ?Social Connections: Not on file  ? ? ?Family History  ?Problem Relation Age of Onset  ? Throat cancer Father   ?     throat/neck  ? Other Mother   ?     pacemaker  ? ? ?Health Maintenance  ?Topic Date Due  ? COVID-19 Vaccine (1) Never done  ? Hepatitis C Screening  Never done  ? TETANUS/TDAP  Never done  ? Zoster Vaccines- Shingrix (1 of 2) Never done  ? INFLUENZA VACCINE  05/01/2021  ? COLONOSCOPY (Pts 45-78yr Insurance coverage will need to be confirmed)  10/02/2027  ? Pneumonia Vaccine 76 Years old  Completed  ? HPV VACCINES  Aged Out  ? ? ? ?----------------------------------------------------------------------------------------------------------------------------------------------------------------------------------------------------------------- ?Physical Exam ?BP (!) 137/55   Pulse 60   Temp 98.1 ?F (36.7 ?C)   Ht '5\' 11"'$  (1.803 m)   Wt 227 lb (103 kg)   SpO2 98%   BMI 31.66 kg/m?  ? ?Physical Exam ?Constitutional:   ?   Appearance: Normal appearance.  ?Eyes:  ?   General: No scleral icterus. ?Cardiovascular:  ?   Rate and Rhythm: Normal rate and regular rhythm.  ?Pulmonary:  ?   Effort: Pulmonary effort is  normal.  ?   Breath sounds: Normal breath sounds.  ?Musculoskeletal:  ?   Cervical back: Neck supple.  ?Neurological:  ?   General: No focal deficit present.  ?   Mental Status: He is alert.  ?Psychiatric:     ?   Mood and Affect: Mood normal.     ?   Behavior: Behavior normal.  ? ? ?------------------------------------------------------------------------------------------------------------------------------------------------------------------------------------------------------------------- ?Assessment and Plan ? ?Essential hypertension ?Blood pressure is well controlled with combination of atenolol and chlorthalidone.  We will continue at current strength.  Updated labs  ordered. ? ?Hyperlipidemia ?Update lipid panel. ? ?Prediabetes ?Rechecking A1c today. ? ? ?Meds ordered this encounter  ?Medications  ? atenolol-chlorthalidone (TENORETIC) 50-25 MG tablet  ?  Sig: Take 0.5 tablets by mouth 2 (two) times daily.  ?  Dispense:  90 tablet  ?  Refill:  3  ? ? ?Return in about 1 year (around 12/05/2022) for HTN/Labs. ? ? ? ?This visit occurred during the SARS-CoV-2 public health emergency.  Safety protocols were in place, including screening questions prior to the visit, additional usage of staff PPE, and extensive cleaning of exam room while observing appropriate contact time as indicated for disinfecting solutions.  ? ?

## 2021-12-04 NOTE — Assessment & Plan Note (Signed)
Update lipid panel.  

## 2021-12-05 LAB — LIPID PANEL W/REFLEX DIRECT LDL
Cholesterol: 222 mg/dL — ABNORMAL HIGH (ref <200)
HDL: 41 mg/dL (ref >=40)
LDL Cholesterol (Calc): 152 mg/dL (calc) — ABNORMAL HIGH
Non-HDL Cholesterol (Calc): 181 mg/dL (calc) — ABNORMAL HIGH (ref <130)
Total CHOL/HDL Ratio: 5.4 (calc) — ABNORMAL HIGH (ref <5.0)
Triglycerides: 154 mg/dL — ABNORMAL HIGH (ref <150)

## 2021-12-05 LAB — CBC WITH DIFFERENTIAL/PLATELET
Absolute Monocytes: 608 cells/uL (ref 200–950)
Basophils Absolute: 40 cells/uL (ref 0–200)
Basophils Relative: 0.5 %
Eosinophils Absolute: 166 cells/uL (ref 15–500)
Eosinophils Relative: 2.1 %
HCT: 47.8 % (ref 38.5–50.0)
Hemoglobin: 16.7 g/dL (ref 13.2–17.1)
Lymphs Abs: 2188 cells/uL (ref 850–3900)
MCH: 31.7 pg (ref 27.0–33.0)
MCHC: 34.9 g/dL (ref 32.0–36.0)
MCV: 90.7 fL (ref 80.0–100.0)
MPV: 10.2 fL (ref 7.5–12.5)
Monocytes Relative: 7.7 %
Neutro Abs: 4898 cells/uL (ref 1500–7800)
Neutrophils Relative %: 62 %
Platelets: 237 10*3/uL (ref 140–400)
RBC: 5.27 10*6/uL (ref 4.20–5.80)
RDW: 12.6 % (ref 11.0–15.0)
Total Lymphocyte: 27.7 %
WBC: 7.9 10*3/uL (ref 3.8–10.8)

## 2021-12-05 LAB — COMPLETE METABOLIC PANEL WITH GFR
AG Ratio: 1.8 (calc) (ref 1.0–2.5)
ALT: 36 U/L (ref 9–46)
AST: 25 U/L (ref 10–35)
Albumin: 4.4 g/dL (ref 3.6–5.1)
Alkaline phosphatase (APISO): 62 U/L (ref 35–144)
BUN: 21 mg/dL (ref 7–25)
CO2: 26 mmol/L (ref 20–32)
Calcium: 10 mg/dL (ref 8.6–10.3)
Chloride: 103 mmol/L (ref 98–110)
Creat: 0.97 mg/dL (ref 0.70–1.28)
Globulin: 2.5 g/dL (calc) (ref 1.9–3.7)
Glucose, Bld: 91 mg/dL (ref 65–99)
Potassium: 4.1 mmol/L (ref 3.5–5.3)
Sodium: 139 mmol/L (ref 135–146)
Total Bilirubin: 0.7 mg/dL (ref 0.2–1.2)
Total Protein: 6.9 g/dL (ref 6.1–8.1)
eGFR: 81 mL/min/{1.73_m2} (ref >=60)

## 2021-12-05 LAB — HEMOGLOBIN A1C
Hgb A1c MFr Bld: 5.7 % of total Hgb — ABNORMAL HIGH (ref <5.7)
Mean Plasma Glucose: 117 mg/dL
eAG (mmol/L): 6.5 mmol/L

## 2021-12-13 ENCOUNTER — Ambulatory Visit (INDEPENDENT_AMBULATORY_CARE_PROVIDER_SITE_OTHER): Payer: Medicare Other | Admitting: Sports Medicine

## 2021-12-13 ENCOUNTER — Other Ambulatory Visit: Payer: Self-pay

## 2021-12-13 DIAGNOSIS — M4316 Spondylolisthesis, lumbar region: Secondary | ICD-10-CM | POA: Diagnosis not present

## 2021-12-13 MED ORDER — CELECOXIB 200 MG PO CAPS: ORAL_CAPSULE | ORAL | 2 refills | Status: DC

## 2021-12-13 MED ORDER — ACETAMINOPHEN ER 650 MG PO TBCR: 650.0000 mg | EXTENDED_RELEASE_TABLET | Freq: Three times a day (TID) | ORAL | 3 refills | Status: AC

## 2021-12-13 NOTE — Progress Notes (Signed)
? ? ?  Procedures performed today:   ? ?None. ? ?Independent interpretation of notes and tests performed by another provider:  ? ?X-rays personally reviewed, there is widespread DDD with anterolisthesis of L4 and L5. ? ?Brief History, Exam, Impression, and Recommendations:   ? ?Spondylolisthesis at L4-L5 level ?Pleasant 76 year old male, several months of increasing right posterior/lateral hip pain with radiation down to the calf, better when laying down in bed. ?He has at this point had 6 weeks of physician directed conservative treatment, he continued with over-the-counter ibuprofen and Tylenol, none of which are significantly helping. ?Has significant discomfort in the morning. ?We will switch his ibuprofen to Celebrex to be taken at night, and arthritis strength Tylenol 3 times daily, considering failure of conservative treatment we will also proceed with a lumbar spine MRI for epidural planning. ? ? ? ?___________________________________________ ?Gwen Her. Dianah Field, M.D., ABFM., CAQSM. ?Primary Care and Sports Medicine ?[place] ? ?Adjunct Instructor of Family Medicine  ?[employer] ?

## 2021-12-13 NOTE — Assessment & Plan Note (Signed)
Pleasant 76 year old male, several months of increasing right posterior/lateral hip pain with radiation down to the calf, better when laying down in bed. ?He has at this point had 6 weeks of physician directed conservative treatment, he continued with over-the-counter ibuprofen and Tylenol, none of which are significantly helping. ?Has significant discomfort in the morning. ?We will switch his ibuprofen to Celebrex to be taken at night, and arthritis strength Tylenol 3 times daily, considering failure of conservative treatment we will also proceed with a lumbar spine MRI for epidural planning. ?

## 2021-12-17 ENCOUNTER — Ambulatory Visit (INDEPENDENT_AMBULATORY_CARE_PROVIDER_SITE_OTHER): Payer: Medicare Other

## 2021-12-17 ENCOUNTER — Other Ambulatory Visit: Payer: Self-pay

## 2021-12-17 DIAGNOSIS — M5116 Intervertebral disc disorders with radiculopathy, lumbar region: Secondary | ICD-10-CM | POA: Diagnosis not present

## 2021-12-17 DIAGNOSIS — M4316 Spondylolisthesis, lumbar region: Secondary | ICD-10-CM | POA: Diagnosis not present

## 2021-12-17 DIAGNOSIS — M48061 Spinal stenosis, lumbar region without neurogenic claudication: Secondary | ICD-10-CM | POA: Diagnosis not present

## 2021-12-27 ENCOUNTER — Ambulatory Visit (INDEPENDENT_AMBULATORY_CARE_PROVIDER_SITE_OTHER): Payer: Medicare Other | Admitting: Sports Medicine

## 2021-12-27 DIAGNOSIS — M4316 Spondylolisthesis, lumbar region: Secondary | ICD-10-CM

## 2021-12-27 NOTE — Progress Notes (Signed)
? ? ?  Procedures performed today:   ? ?None. ? ?Independent interpretation of notes and tests performed by another provider:  ? ?None. ? ?Brief History, Exam, Impression, and Recommendations:   ? ?Spondylolisthesis at L4-L5 level ?Matthew Hull 76 year old male, chronic low back pain with right-sided L5 radiculitis. ?MRI ultimately shows L4-L5 spondylolisthesis with moderate central canal stenosis, as well as bilateral L5 foraminal stenosis, symptoms are predominantly on the right side but somewhat on the left as well so we will order bilateral L5 transforaminal epidurals. ?Return to see me 1 month after the injection to evaluate relief. ? ? ? ?___________________________________________ ?Gwen Her. Dianah Field, M.D., ABFM., CAQSM. ?Primary Care and Sports Medicine ?[place] ? ?Adjunct Instructor of Family Medicine  ?[employer] ?

## 2021-12-27 NOTE — Assessment & Plan Note (Signed)
Pleasant 76 year old male, chronic low back pain with right-sided L5 radiculitis. ?MRI ultimately shows L4-L5 spondylolisthesis with moderate central canal stenosis, as well as bilateral L5 foraminal stenosis, symptoms are predominantly on the right side but somewhat on the left as well so we will order bilateral L5 transforaminal epidurals. ?Return to see me 1 month after the injection to evaluate relief. ?

## 2022-01-01 ENCOUNTER — Ambulatory Visit
Admission: RE | Admit: 2022-01-01 | Discharge: 2022-01-01 | Disposition: A | Discharge status: Home / Self Care | Payer: Medicare Other | Source: Ambulatory Visit | Attending: Sports Medicine | Admitting: Sports Medicine

## 2022-01-01 DIAGNOSIS — M5126 Other intervertebral disc displacement, lumbar region: Secondary | ICD-10-CM | POA: Diagnosis not present

## 2022-01-01 DIAGNOSIS — M48061 Spinal stenosis, lumbar region without neurogenic claudication: Secondary | ICD-10-CM | POA: Diagnosis not present

## 2022-01-01 DIAGNOSIS — M4316 Spondylolisthesis, lumbar region: Secondary | ICD-10-CM

## 2022-01-01 MED ORDER — METHYLPREDNISOLONE ACETATE 40 MG/ML INJ SUSP (RADIOLOG
80.0000 mg | Freq: Once | INTRAMUSCULAR | Status: AC
  Administered 2022-01-01: 80 mg via EPIDURAL

## 2022-01-01 MED ORDER — IOPAMIDOL (ISOVUE-M 200) INJECTION 41%
1.0000 mL | Freq: Once | INTRAMUSCULAR | Status: AC
  Administered 2022-01-01: 1 mL via EPIDURAL

## 2022-01-01 NOTE — Discharge Instructions (Signed)

## 2022-01-24 ENCOUNTER — Ambulatory Visit (INDEPENDENT_AMBULATORY_CARE_PROVIDER_SITE_OTHER): Payer: Medicare Other | Admitting: Sports Medicine

## 2022-01-24 DIAGNOSIS — M4316 Spondylolisthesis, lumbar region: Secondary | ICD-10-CM | POA: Diagnosis not present

## 2022-01-24 NOTE — Assessment & Plan Note (Signed)
This is a very pleasant 76 year old male, chronic low back pain with right-sided L5 distribution radiculitis, MRI did show L4-L5 spinal listhesis with moderate central canal stenosis as well as bilateral L5 foraminal stenosis. ?Symptoms were prominent on the right side, he had a right L5-S1 transforaminal epidural with fairly good relief but only temporary, we will proceed again with another epidural at this time L4-L5 and interlaminar rather than transforaminal, return to see me 1 month after the injection. ?

## 2022-01-24 NOTE — Progress Notes (Signed)
? ? ?  Procedures performed today:   ? ?None. ? ?Independent interpretation of notes and tests performed by another provider:  ? ?None. ? ?Brief History, Exam, Impression, and Recommendations:   ? ?Spondylolisthesis at L4-L5 level ?This is a very pleasant 76 year old male, chronic low back pain with right-sided L5 distribution radiculitis, MRI did show L4-L5 spinal listhesis with moderate central canal stenosis as well as bilateral L5 foraminal stenosis. ?Symptoms were prominent on the right side, he had a right L5-S1 transforaminal epidural with fairly good relief but only temporary, we will proceed again with another epidural at this time L4-L5 and interlaminar rather than transforaminal, return to see me 1 month after the injection. ? ? ? ?___________________________________________ ?Gwen Her. Dianah Field, M.D., ABFM., CAQSM. ?Primary Care and Sports Medicine ?[place] ? ?Adjunct Instructor of Family Medicine  ?[employer] ?

## 2022-02-05 ENCOUNTER — Ambulatory Visit
Admission: RE | Admit: 2022-02-05 | Discharge: 2022-02-05 | Disposition: A | Discharge status: Home / Self Care | Payer: Medicare Other | Source: Ambulatory Visit | Attending: Sports Medicine | Admitting: Sports Medicine

## 2022-02-05 DIAGNOSIS — M47817 Spondylosis without myelopathy or radiculopathy, lumbosacral region: Secondary | ICD-10-CM | POA: Diagnosis not present

## 2022-02-05 DIAGNOSIS — M4316 Spondylolisthesis, lumbar region: Secondary | ICD-10-CM

## 2022-02-05 MED ORDER — METHYLPREDNISOLONE ACETATE 40 MG/ML INJ SUSP (RADIOLOG
80.0000 mg | Freq: Once | INTRAMUSCULAR | Status: AC
  Administered 2022-02-05: 80 mg via EPIDURAL

## 2022-02-05 MED ORDER — IOPAMIDOL (ISOVUE-M 200) INJECTION 41%
1.0000 mL | Freq: Once | INTRAMUSCULAR | Status: AC
  Administered 2022-02-05: 1 mL via EPIDURAL

## 2022-02-05 NOTE — Discharge Instructions (Signed)

## 2022-02-13 ENCOUNTER — Ambulatory Visit (INDEPENDENT_AMBULATORY_CARE_PROVIDER_SITE_OTHER): Payer: Medicare Other | Admitting: Family Medicine

## 2022-02-13 DIAGNOSIS — Z0000 Encounter for general adult medical examination without abnormal findings: Secondary | ICD-10-CM

## 2022-02-13 NOTE — Progress Notes (Signed)
? ? ?MEDICARE ANNUAL WELLNESS VISIT ? ?02/13/2022 ? ?Telephone Visit Disclaimer ?This Medicare AWV was conducted by telephone due to national recommendations for restrictions regarding the COVID-19 Pandemic (e.g. social distancing).  I verified, using two identifiers, that I am speaking with Matthew Hull or their authorized healthcare agent. I discussed the limitations, risks, security, and privacy concerns of performing an evaluation and management service by telephone and the potential availability of an in-person appointment in the future. The patient expressed understanding and agreed to proceed.  ?Location of Patient: Home ?Location of Provider (nurse):  In the office. ? ?Subjective:  ? ? ?Matthew Hull is a 76 y.o. male patient of Matthew Nutting, DO who had a Medicare Annual Wellness Visit today via telephone. Riyaan is Retired and lives with their spouse and a granddaughter and her son. he has 5 children. he reports that he is socially active and does interact with friends/family regularly. he is minimally physically active and enjoys being a Doctor, general practice. ? ?Patient Care Team: ?Matthew Nutting, DO as PCP - General (Family Medicine) ? ? ?  02/13/2022  ?  1:09 PM 03/02/2019  ? 10:12 AM 02/16/2014  ?  9:17 AM  ?Advanced Directives  ?Does Patient Have a Medical Advance Directive? No No Patient would not like information;Patient does not have advance directive  ?Would patient like information on creating a medical advance directive? No - Patient declined No - Patient declined   ? ? ?Hospital Utilization Over the Past 12 Months: ?# of hospitalizations or ER visits: 0 ?# of surgeries: 0 ? ?Review of Systems    ?Patient reports that his overall health is unchanged compared to last year. ? ?History obtained from chart review and the patient ? ?Patient Reported Readings (BP, Pulse, CBG, Weight, etc) ?none ? ?Pain Assessment ?Pain : No/denies pain ? ?  ? ?Current Medications & Allergies (verified) ?Allergies as of 02/13/2022    ? ?   Reactions  ? Atorvastatin Other (See Comments)  ? cramping  ? ?  ? ?  ?Medication List  ?  ? ?  ? Accurate as of Feb 13, 2022  1:28 PM. If you have any questions, ask your nurse or doctor.  ?  ?  ? ?  ? ?acetaminophen 650 MG CR tablet ?Commonly known as: TYLENOL ?Take 1 tablet (650 mg total) by mouth every 8 (eight) hours. ?  ?aspirin 81 MG tablet ?Take 81 mg by mouth daily. ?  ?atenolol-chlorthalidone 50-25 MG tablet ?Commonly known as: TENORETIC ?Take 0.5 tablets by mouth 2 (two) times daily. ?  ?celecoxib 200 MG capsule ?Commonly known as: CeleBREX ?1 to 2 tablets at dinnertime ?  ?diclofenac sodium 1 % Gel ?Commonly known as: VOLTAREN ?Apply 4 g topically 4 (four) times daily. To affected joint. ?  ?FISH OIL-KRILL OIL PO ?Take 1,000 mg by mouth. ?  ?KRILL OIL OMEGA-3 PO ?Take by mouth. ?  ?Super Twin EPA/DHA 1250 MG Caps ?Take 1 capsule by mouth daily. ?  ?TURMERIC PO ?Take by mouth. ?  ? ?  ? ? ?History (reviewed): ?Past Medical History:  ?Diagnosis Date  ? Cataract   ? bilateral  ? Mixed hyperlipidemia   ? Unspecified essential hypertension   ? unspecified  ? ?Past Surgical History:  ?Procedure Laterality Date  ? Curettage osteomyelitis focus; right middle finger    ? I&D of skin and subcutaneous tissue, bone and nonviable, necrotic tissure, right middle finger    ? ?Family History  ?Problem Relation Age  of Onset  ? Throat cancer Father   ?     throat/neck  ? Other Mother   ?     pacemaker  ? ?Social History  ? ?Socioeconomic History  ? Marital status: Married  ?  Spouse name: [name]  ? Number of children: 5  ? Years of education: 3  ? Highest education level: Associate degree: academic program  ?Occupational History  ? Occupation: [employer]  ?  Comment: retired  ?Tobacco Use  ? Smoking status: Never  ? Smokeless tobacco: Never  ?Vaping Use  ? Vaping Use: Never used  ?Substance and Sexual Activity  ? Alcohol use: Yes  ?  Alcohol/week: 1.0 standard drink  ?  Types: 1 Cans of beer per week  ?   Comment: occasionally  ? Drug use: No  ? Sexual activity: Not Currently  ?  Birth control/protection: Abstinence  ?Other Topics Concern  ? Not on file  ?Social History Narrative  ? Lives with his wife and one grand daughter and her son. He enjoys being Doctor, general practice.  ? ?Social Determinants of Health  ? ?Financial Resource Strain: Low Risk   ? Difficulty of Paying Living Expenses: Not hard at all  ?Food Insecurity: No Food Insecurity  ? Worried About Charity fundraiser in the Last Year: Never true  ? Ran Out of Food in the Last Year: Never true  ?Transportation Needs: No Transportation Needs  ? Lack of Transportation (Medical): No  ? Lack of Transportation (Non-Medical): No  ?Physical Activity: Inactive  ? Days of Exercise per Week: 0 days  ? Minutes of Exercise per Session: 0 min  ?Stress: No Stress Concern Present  ? Feeling of Stress : Not at all  ?Social Connections: Socially Integrated  ? Frequency of Communication with Friends and Family: Three times a week  ? Frequency of Social Gatherings with Friends and Family: Twice a week  ? Attends Religious Services: More than 4 times per year  ? Active Member of Clubs or Organizations: Yes  ? Attends Archivist Meetings: More than 4 times per year  ? Marital Status: Married  ? ? ?Activities of Daily Living ? ?  02/13/2022  ?  1:12 PM  ?In your present state of health, do you have any difficulty performing the following activities:  ?Hearing? 0  ?Vision? 0  ?Difficulty concentrating or making decisions? 0  ?Walking or climbing stairs? 0  ?Dressing or bathing? 0  ?Doing errands, shopping? 0  ?Preparing Food and eating ? N  ?Using the Toilet? N  ?In the past six months, have you accidently leaked urine? N  ?Do you have problems with loss of bowel control? N  ?Managing your Medications? N  ?Managing your Finances? N  ?Housekeeping or managing your Housekeeping? N  ? ? ?Patient Education/ Literacy ?How often do you need to have someone help you when you read  instructions, pamphlets, or other written materials from your doctor or pharmacy?: 1 - Never ?What is the last grade level you completed in school?: Associates degree ? ?Exercise ?Current Exercise Habits: The patient has a physically strenuous job, but has no regular exercise apart from work., Exercise limited by: orthopedic condition(s) ? ?Diet ?Patient reports consuming 2 meals a day and 0 snack(s) a day ?Patient reports that his primary diet is: Regular ?Patient reports that she does have regular access to food.  ? ?Depression Screen ? ?  02/13/2022  ?  1:10 PM 12/04/2021  ?  2:28  PM 03/02/2019  ? 10:13 AM 12/09/2018  ? 10:34 AM 02/26/2018  ?  2:55 PM 07/23/2016  ?  1:22 PM 02/16/2014  ?  9:17 AM  ?PHQ 2/9 Scores  ?PHQ - 2 Score 0 0 0 0 0 0 0  ?PHQ- 9 Score    2 0    ?  ? ?Fall Risk ? ?  02/13/2022  ?  1:10 PM 12/04/2021  ?  2:28 PM 03/02/2019  ? 10:13 AM 09/05/2017  ?  5:27 PM 07/23/2016  ?  1:22 PM  ?Fall Risk   ?Falls in the past year? 0 1 1 No No  ?Comment    Emmi Telephone Survey: data to providers prior to load   ?Number falls in past yr: 0 1 0    ?Injury with Fall? 0 0 1    ?Risk for fall due to : No Fall Risks Impaired balance/gait;Impaired mobility     ?Follow up Falls evaluation completed Falls evaluation completed Falls prevention discussed    ? ?  ?Objective:  ?Matthew Hull seemed alert and oriented and he participated appropriately during our telephone visit. ? ?Blood Pressure Weight BMI  ?BP Readings from Last 3 Encounters:  ?02/05/22 (!) 158/82  ?01/01/22 (!) 151/81  ?12/04/21 (!) 137/55  ? Wt Readings from Last 3 Encounters:  ?12/04/21 227 lb (103 kg)  ?06/14/20 246 lb (111.6 kg)  ?12/09/19 249 lb 1.9 oz (113 kg)  ? BMI Readings from Last 1 Encounters:  ?12/04/21 31.66 kg/m?  ?  ?*Unable to obtain current vital signs, weight, and BMI due to telephone visit type ? ?Hearing/Vision  ?Mattthew did not seem to have difficulty with hearing/understanding during the telephone conversation ?Reports that he has had a formal  eye exam by an eye care professional within the past year ?Reports that he has not had a formal hearing evaluation within the past year ?*Unable to fully assess hearing and vision during telephone visit ty

## 2022-02-13 NOTE — Patient Instructions (Addendum)
?MEDICARE ANNUAL WELLNESS VISIT ?Health Maintenance Summary and Written Plan of Care ? ?Matthew Hull , ? ?Thank you for allowing me to perform your Medicare Annual Wellness Visit and for your ongoing commitment to your health.  ? ?Health Maintenance & Immunization History ?Health Maintenance  ?Topic Date Due  ? COVID-19 Vaccine (1) 03/01/2022 (Originally 11/26/1946)  ? TETANUS/TDAP  02/14/2023 (Originally 05/26/1965)  ? Hepatitis C Screening  02/14/2023 (Originally 05/26/1964)  ? INFLUENZA VACCINE  05/01/2022  ? COLONOSCOPY (Pts 45-49yr Insurance coverage will need to be confirmed)  10/02/2027  ? Pneumonia Vaccine 76 Years old  Completed  ? Zoster Vaccines- Shingrix  Completed  ? HPV VACCINES  Aged Out  ? ?Immunization History  ?Administered Date(s) Administered  ? Fluad Quad(high Dose 65+) 06/11/2019, 06/14/2020  ? Influenza, High Dose Seasonal PF 08/28/2017, 09/01/2018  ? Influenza-Unspecified 07/01/2014  ? Pneumococcal Conjugate-13 03/20/2016  ? Pneumococcal Polysaccharide-23 02/26/2018  ? Zoster Recombinat (Shingrix) 04/22/2020, 05/02/2021  ? ? ?These are the patient goals that we discussed: ? Goals Addressed   ? ?  ?  ?  ?  ?  ? This Visit's Progress  ?   Patient Stated (pt-stated)     ?   Patient would like to continue to stay healthy and not get any worse. ?  ? ?  ?  ? ?This is a list of Health Maintenance Items that are overdue or due now: ?TD Vaccine ?Hep C screening ?  ? ?Orders/Referrals Placed Today: ?No orders of the defined types were placed in this encounter. ? ?(Contact our referral department at [phone] you have not spoken with someone about your referral appointment within the next 5 days)  ? ? ?Follow-up Plan ?Follow-up with MLuetta Nutting DO as planned ?Schedule your Tetanus shot at your pharmacy.  ?Medicare wellness visit in one year. ?Patient will access AVS on my chart. ? ?  ?Health Maintenance, Male ?Adopting a healthy lifestyle and getting preventive care are important in promoting  health and wellness. Ask your health care provider about: ?The right schedule for you to have regular tests and exams. ?Things you can do on your own to prevent diseases and keep yourself healthy. ?What should I know about diet, weight, and exercise? ?Eat a healthy diet ? ?Eat a diet that includes plenty of vegetables, fruits, low-fat dairy products, and lean protein. ?Do not eat a lot of foods that are high in solid fats, added sugars, or sodium. ?Maintain a healthy weight ?Body mass index (BMI) is a measurement that can be used to identify possible weight problems. It estimates body fat based on height and weight. Your health care provider can help determine your BMI and help you achieve or maintain a healthy weight. ?Get regular exercise ?Get regular exercise. This is one of the most important things you can do for your health. Most adults should: ?Exercise for at least 150 minutes each week. The exercise should increase your heart rate and make you sweat (moderate-intensity exercise). ?Do strengthening exercises at least twice a week. This is in addition to the moderate-intensity exercise. ?Spend less time sitting. Even light physical activity can be beneficial. ?Watch cholesterol and blood lipids ?Have your blood tested for lipids and cholesterol at 76years of age, then have this test every 5 years. ?You may need to have your cholesterol levels checked more often if: ?Your lipid or cholesterol levels are high. ?You are older than 76years of age. ?You are at high risk for heart disease. ?What should  I know about cancer screening? ?Many types of cancers can be detected early and may often be prevented. Depending on your health history and family history, you may need to have cancer screening at various ages. This may include screening for: ?Colorectal cancer. ?Prostate cancer. ?Skin cancer. ?Lung cancer. ?What should I know about heart disease, diabetes, and high blood pressure? ?Blood pressure and heart  disease ?High blood pressure causes heart disease and increases the risk of stroke. This is more likely to develop in people who have high blood pressure readings or are overweight. ?Talk with your health care provider about your target blood pressure readings. ?Have your blood pressure checked: ?Every 3-5 years if you are 40-20 years of age. ?Every year if you are 48 years old or older. ?If you are between the ages of 22 and 77 and are a current or former smoker, ask your health care provider if you should have a one-time screening for abdominal aortic aneurysm (AAA). ?Diabetes ?Have regular diabetes screenings. This checks your fasting blood sugar level. Have the screening done: ?Once every three years after age 91 if you are at a normal weight and have a low risk for diabetes. ?More often and at a younger age if you are overweight or have a high risk for diabetes. ?What should I know about preventing infection? ?Hepatitis B ?If you have a higher risk for hepatitis B, you should be screened for this virus. Talk with your health care provider to find out if you are at risk for hepatitis B infection. ?Hepatitis C ?Blood testing is recommended for: ?Everyone born from [birth year] through 1965. ?Anyone with known risk factors for hepatitis C. ?Sexually transmitted infections (STIs) ?You should be screened each year for STIs, including gonorrhea and chlamydia, if: ?You are sexually active and are younger than 76 years of age. ?You are older than 76 years of age and your health care provider tells you that you are at risk for this type of infection. ?Your sexual activity has changed since you were last screened, and you are at increased risk for chlamydia or gonorrhea. Ask your health care provider if you are at risk. ?Ask your health care provider about whether you are at high risk for HIV. Your health care provider may recommend a prescription medicine to help prevent HIV infection. If you choose to take medicine to prevent  HIV, you should first get tested for HIV. You should then be tested every 3 months for as long as you are taking the medicine. ?Follow these instructions at home: ?Alcohol use ?Do not drink alcohol if your health care provider tells you not to drink. ?If you drink alcohol: ?Limit how much you have to 0-2 drinks a day. ?Know how much alcohol is in your drink. In the [place] one drink equals one 12 oz bottle of beer (355 mL), one 5 oz glass of wine (148 mL), or one 1? oz glass of hard liquor (44 mL). ?Lifestyle ?Do not use any products that contain nicotine or tobacco. These products include cigarettes, chewing tobacco, and vaping devices, such as e-cigarettes. If you need help quitting, ask your health care provider. ?Do not use street drugs. ?Do not share needles. ?Ask your health care provider for help if you need support or information about quitting drugs. ?General instructions ?Schedule regular health, dental, and eye exams. ?Stay current with your vaccines. ?Tell your health care provider if: ?You often feel depressed. ?You have ever been abused or do not feel safe at  home. ?Summary ?Adopting a healthy lifestyle and getting preventive care are important in promoting health and wellness. ?Follow your health care provider's instructions about healthy diet, exercising, and getting tested or screened for diseases. ?Follow your health care provider's instructions on monitoring your cholesterol and blood pressure. ?This information is not intended to replace advice given to you by your health care provider. Make sure you discuss any questions you have with your health care provider. ?Document Revised: 02/06/2021 Document Reviewed: 02/06/2021 ?Elsevier Patient Education ? [place]. ? ?

## 2022-03-09 ENCOUNTER — Ambulatory Visit (INDEPENDENT_AMBULATORY_CARE_PROVIDER_SITE_OTHER): Payer: Medicare Other | Admitting: Sports Medicine

## 2022-03-09 DIAGNOSIS — M4316 Spondylolisthesis, lumbar region: Secondary | ICD-10-CM

## 2022-03-09 NOTE — Progress Notes (Signed)
    Procedures performed today:    None.  Independent interpretation of notes and tests performed by another provider:   None.  Brief History, Exam, Impression, and Recommendations:    Spondylolisthesis at L4-L5 level Matthew Hull returns, he is a very pleasant 76 year old male, chronic low back pain with right-sided L5 distribution radiculitis, MRI did show L4-L5 spondylolisthesis with moderate central canal stenosis as well as bilateral L5 foraminal stenosis. We started with a right L5-S1 transforaminal epidural with fairly good relief, this was temporary, we ordered another epidural, done about a month ago, L4-L5 and interlaminar and he reports even better improvement, has very little discomfort, only bit of stiffness in the morning. He will try some Celebrex later at night in the hopes of reducing stiffness in the morning, and can call me for the third epidural if he would like, otherwise return as needed.    ___________________________________________ Gwen Her. Dianah Field, M.D., ABFM., CAQSM. Primary Care and [place] Instructor of [employer]

## 2022-03-09 NOTE — Assessment & Plan Note (Signed)
Legacy returns, he is a very pleasant 76 year old male, chronic low back pain with right-sided L5 distribution radiculitis, MRI did show L4-L5 spondylolisthesis with moderate central canal stenosis as well as bilateral L5 foraminal stenosis. We started with a right L5-S1 transforaminal epidural with fairly good relief, this was temporary, we ordered another epidural, done about a month ago, L4-L5 and interlaminar and he reports even better improvement, has very little discomfort, only bit of stiffness in the morning. He will try some Celebrex later at night in the hopes of reducing stiffness in the morning, and can call me for the third epidural if he would like, otherwise return as needed.

## 2022-07-19 DIAGNOSIS — M9904 Segmental and somatic dysfunction of sacral region: Secondary | ICD-10-CM | POA: Diagnosis not present

## 2022-07-19 DIAGNOSIS — M5441 Lumbago with sciatica, right side: Secondary | ICD-10-CM | POA: Diagnosis not present

## 2022-07-24 DIAGNOSIS — M5441 Lumbago with sciatica, right side: Secondary | ICD-10-CM | POA: Diagnosis not present

## 2022-07-24 DIAGNOSIS — M9904 Segmental and somatic dysfunction of sacral region: Secondary | ICD-10-CM | POA: Diagnosis not present

## 2022-07-30 DIAGNOSIS — M5441 Lumbago with sciatica, right side: Secondary | ICD-10-CM | POA: Diagnosis not present

## 2022-07-30 DIAGNOSIS — M9904 Segmental and somatic dysfunction of sacral region: Secondary | ICD-10-CM | POA: Diagnosis not present

## 2022-08-02 DIAGNOSIS — M5441 Lumbago with sciatica, right side: Secondary | ICD-10-CM | POA: Diagnosis not present

## 2022-08-02 DIAGNOSIS — M9904 Segmental and somatic dysfunction of sacral region: Secondary | ICD-10-CM | POA: Diagnosis not present

## 2022-08-14 DIAGNOSIS — H04123 Dry eye syndrome of bilateral lacrimal glands: Secondary | ICD-10-CM | POA: Diagnosis not present

## 2022-08-14 DIAGNOSIS — H35362 Drusen (degenerative) of macula, left eye: Secondary | ICD-10-CM | POA: Diagnosis not present

## 2022-08-21 ENCOUNTER — Encounter: Payer: Self-pay | Admitting: Sports Medicine

## 2022-08-21 DIAGNOSIS — M4316 Spondylolisthesis, lumbar region: Secondary | ICD-10-CM

## 2022-08-21 NOTE — Telephone Encounter (Signed)
I spent 5 total minutes of online digital evaluation and management services in this patient-initiated request for online care. 

## 2022-09-03 ENCOUNTER — Ambulatory Visit
Admission: RE | Admit: 2022-09-03 | Discharge: 2022-09-03 | Disposition: A | Discharge status: Home / Self Care | Payer: Medicare Other | Source: Ambulatory Visit | Attending: Sports Medicine | Admitting: Sports Medicine

## 2022-09-03 DIAGNOSIS — M4316 Spondylolisthesis, lumbar region: Secondary | ICD-10-CM

## 2022-09-03 DIAGNOSIS — M549 Dorsalgia, unspecified: Secondary | ICD-10-CM | POA: Diagnosis not present

## 2022-09-03 MED ORDER — IOPAMIDOL (ISOVUE-M 200) INJECTION 41%
1.0000 mL | Freq: Once | INTRAMUSCULAR | Status: AC
  Administered 2022-09-03: 1 mL via EPIDURAL

## 2022-09-03 MED ORDER — METHYLPREDNISOLONE ACETATE 40 MG/ML INJ SUSP (RADIOLOG
80.0000 mg | Freq: Once | INTRAMUSCULAR | Status: AC
  Administered 2022-09-03: 80 mg via EPIDURAL

## 2022-09-03 NOTE — Discharge Instructions (Signed)
Post Procedure Spinal Discharge Instruction Sheet  You may resume a regular diet and any medications that you routinely take (including pain medications) unless otherwise noted by MD.  No driving day of procedure.  Light activity throughout the rest of the day.  Do not do any strenuous work, exercise, bending or lifting.  The day following the procedure, you can resume normal physical activity but you should refrain from exercising or physical therapy for at least three days thereafter.  You may apply ice to the injection site, 20 minutes on, 20 minutes off, as needed. Do not apply ice directly to skin.    Common Side Effects:  Headaches- take your usual medications as directed by your physician.  Increase your fluid intake.  Caffeinated beverages may be helpful.  Lie flat in bed until your headache resolves.  Restlessness or inability to sleep- you may have trouble sleeping for the next few days.  Ask your referring physician if you need any medication for sleep.  Facial flushing or redness- should subside within a few days.  Increased pain- a temporary increase in pain a day or two following your procedure is not unusual.  Take your pain medication as prescribed by your referring physician.  Leg cramps  Please contact our office at [phone] for the following symptoms: Fever greater than 100 degrees. Headaches unresolved with medication after 2-3 days. Increased swelling, pain, or redness at injection site.   Thank you for visiting [facility] today.   MAY RESUME ASPIRIN AFTER PROCEDURE TODAY.

## 2022-12-05 ENCOUNTER — Encounter: Payer: Self-pay | Admitting: Family Medicine

## 2022-12-05 ENCOUNTER — Ambulatory Visit (INDEPENDENT_AMBULATORY_CARE_PROVIDER_SITE_OTHER): Payer: Medicare Other | Admitting: Family Medicine

## 2022-12-05 VITALS — BP 145/77 | HR 60 | Ht 71.0 in | Wt 230.0 lb

## 2022-12-05 DIAGNOSIS — E785 Hyperlipidemia, unspecified: Secondary | ICD-10-CM | POA: Diagnosis not present

## 2022-12-05 DIAGNOSIS — R7303 Prediabetes: Secondary | ICD-10-CM

## 2022-12-05 DIAGNOSIS — I10 Essential (primary) hypertension: Secondary | ICD-10-CM | POA: Diagnosis not present

## 2022-12-05 NOTE — Assessment & Plan Note (Signed)
Intolerant to atorvastatin in the past.  Update lipid panel.

## 2022-12-05 NOTE — Assessment & Plan Note (Signed)
BP is mildly elevated today.  Better readings at home.  He'll continue current medications.  Would prefer to be seen once annually.  I will see him back in 1 year or sooner if needed.

## 2022-12-05 NOTE — Assessment & Plan Note (Signed)
Update A1c ?

## 2022-12-05 NOTE — Progress Notes (Signed)
Matthew Hull - 77 y.o. male MRN [id number]  Date of birth: 08-05-1946  Subjective Chief Complaint  Patient presents with   Hypertension    HPI Matthew Hull is a 77 y.o. male here today for follow up visit.   BP is treated with combination of atenolol/chlorthalidone.  He is tolerating medication well at current strength.  He has not had chest pain, shortness of breath, palpitations, headache or vision changes.  Doesn't really watch his sodium intake.  He is moderately active.   He continues to have chronic low back pain.  Seen by Dr. Dianah Field for this.   ROS:  A comprehensive ROS was completed and negative except as noted per HPI    Allergies  Allergen Reactions   Atorvastatin Other (See Comments)    cramping     Past Medical History:  Diagnosis Date   Cataract    bilateral   Mixed hyperlipidemia    Unspecified essential hypertension    unspecified    Past Surgical History:  Procedure Laterality Date   Curettage osteomyelitis focus; right middle finger     I&D of skin and subcutaneous tissue, bone and nonviable, necrotic tissure, right middle finger      Social History   Socioeconomic History   Marital status: Married    Spouse name: [name]   Number of children: 5   Years of education: 14   Highest education level: Associate degree: academic program  Occupational History   Occupation: Futures trader    Comment: retired  Tobacco Use   Smoking status: Never   Smokeless tobacco: Never  Vaping Use   Vaping Use: Never used  Substance and Sexual Activity   Alcohol use: Yes    Alcohol/week: 1.0 standard drink of alcohol    Types: 1 Cans of beer per week    Comment: occasionally   Drug use: No   Sexual activity: Not Currently    Birth control/protection: Abstinence  Other Topics Concern   Not on file  Social History Narrative   Lives with his wife and one grand daughter and her son. He enjoys being Doctor, general practice.   Social Determinants of Health    Financial Resource Strain: Low Risk  (02/13/2022)   Overall Financial Resource Strain (CARDIA)    Difficulty of Paying Living Expenses: Not hard at all  Food Insecurity: No Food Insecurity (02/13/2022)   Hunger Vital Sign    Worried About Running Out of Food in the Last Year: Never true    Ran Out of Food in the Last Year: Never true  Transportation Needs: No Transportation Needs (02/13/2022)   PRAPARE - Hydrologist (Medical): No    Lack of Transportation (Non-Medical): No  Physical Activity: Inactive (02/13/2022)   Exercise Vital Sign    Days of Exercise per Week: 0 days    Minutes of Exercise per Session: 0 min  Stress: No Stress Concern Present (02/13/2022)   [place]    Feeling of Stress : Not at all  Social Connections: [employer] (02/13/2022)   Social Connection and Isolation Panel [NHANES]    Frequency of Communication with Friends and Family: Three times a week    Frequency of Social Gatherings with Friends and Family: Twice a week    Attends Religious Services: More than 4 times per year    Active Member of [employer] or Organizations: Yes    Attends Archivist Meetings: More than  4 times per year    Marital Status: Married    Family History  Problem Relation Age of Onset   Throat cancer Father        throat/neck   Other Mother        pacemaker    Health Maintenance  Topic Date Due   DTaP/Tdap/Td (1 - Tdap) Never done   INFLUENZA VACCINE  12/30/2022 (Originally 05/01/2022)   Hepatitis C Screening  02/14/2023 (Originally 05/26/1964)   COVID-19 Vaccine (1) 06/02/2023 (Originally 11/26/1946)   Medicare Annual Wellness (AWV)  02/14/2023   Pneumonia Vaccine 18+ Years old  Completed   Zoster Vaccines- Shingrix  Completed   HPV VACCINES  Aged Out   COLONOSCOPY (Pts 45-83yr Insurance coverage will need to be confirmed)  Discontinued      ----------------------------------------------------------------------------------------------------------------------------------------------------------------------------------------------------------------- Physical Exam BP (!) 145/77 (BP Location: Left Arm, Patient Position: Sitting, Cuff Size: Large)   Pulse 60   Ht '5\' 11"'$  (1.803 m)   Wt 230 lb (104.3 kg)   SpO2 95%   BMI 32.08 kg/m   Physical Exam Constitutional:      Appearance: Normal appearance.  HENT:     Head: Normocephalic and atraumatic.  Eyes:     General: No scleral icterus. Cardiovascular:     Rate and Rhythm: Normal rate and regular rhythm.  Pulmonary:     Effort: Pulmonary effort is normal.     Breath sounds: Normal breath sounds.  Musculoskeletal:     Cervical back: Neck supple.  Neurological:     Mental Status: He is alert.  Psychiatric:        Mood and Affect: Mood normal.        Behavior: Behavior normal.     ------------------------------------------------------------------------------------------------------------------------------------------------------------------------------------------------------------------- Assessment and Plan  Essential hypertension BP is mildly elevated today.  Better readings at home.  He'll continue current medications.  Would prefer to be seen once annually.  I will see him back in 1 year or sooner if needed.   Prediabetes Update A1c   Hyperlipidemia Intolerant to atorvastatin in the past.  Update lipid panel.    No orders of the defined types were placed in this encounter.   No follow-ups on file.    This visit occurred during the SARS-CoV-2 public health emergency.  Safety protocols were in place, including screening questions prior to the visit, additional usage of staff PPE, and extensive cleaning of exam room while observing appropriate contact time as indicated for disinfecting solutions.

## 2022-12-06 LAB — COMPLETE METABOLIC PANEL WITH GFR
AG Ratio: 1.8 (calc) (ref 1.0–2.5)
ALT: 37 U/L (ref 9–46)
AST: 26 U/L (ref 10–35)
Albumin: 4.6 g/dL (ref 3.6–5.1)
Alkaline phosphatase (APISO): 54 U/L (ref 35–144)
BUN/Creatinine Ratio: 26 (calc) — ABNORMAL HIGH (ref 6–22)
BUN: 26 mg/dL — ABNORMAL HIGH (ref 7–25)
CO2: 31 mmol/L (ref 20–32)
Calcium: 10 mg/dL (ref 8.6–10.3)
Chloride: 101 mmol/L (ref 98–110)
Creat: 1.01 mg/dL (ref 0.70–1.28)
Globulin: 2.5 g/dL (calc) (ref 1.9–3.7)
Glucose, Bld: 90 mg/dL (ref 65–99)
Potassium: 3.4 mmol/L — ABNORMAL LOW (ref 3.5–5.3)
Sodium: 141 mmol/L (ref 135–146)
Total Bilirubin: 0.7 mg/dL (ref 0.2–1.2)
Total Protein: 7.1 g/dL (ref 6.1–8.1)
eGFR: 77 mL/min/{1.73_m2} (ref >=60)

## 2022-12-06 LAB — CBC WITH DIFFERENTIAL/PLATELET
Absolute Monocytes: 591 cells/uL (ref 200–950)
Basophils Absolute: 51 cells/uL (ref 0–200)
Basophils Relative: 0.7 %
Eosinophils Absolute: 102 cells/uL (ref 15–500)
Eosinophils Relative: 1.4 %
HCT: 49.8 % (ref 38.5–50.0)
Hemoglobin: 18 g/dL — ABNORMAL HIGH (ref 13.2–17.1)
Lymphs Abs: 2329 cells/uL (ref 850–3900)
MCH: 32.4 pg (ref 27.0–33.0)
MCHC: 36.1 g/dL — ABNORMAL HIGH (ref 32.0–36.0)
MCV: 89.7 fL (ref 80.0–100.0)
MPV: 10 fL (ref 7.5–12.5)
Monocytes Relative: 8.1 %
Neutro Abs: 4227 cells/uL (ref 1500–7800)
Neutrophils Relative %: 57.9 %
Platelets: 239 10*3/uL (ref 140–400)
RBC: 5.55 10*6/uL (ref 4.20–5.80)
RDW: 12.2 % (ref 11.0–15.0)
Total Lymphocyte: 31.9 %
WBC: 7.3 10*3/uL (ref 3.8–10.8)

## 2022-12-06 LAB — LIPID PANEL W/REFLEX DIRECT LDL
Cholesterol: 230 mg/dL — ABNORMAL HIGH (ref <200)
HDL: 51 mg/dL (ref >=40)
LDL Cholesterol (Calc): 155 mg/dL (calc) — ABNORMAL HIGH
Non-HDL Cholesterol (Calc): 179 mg/dL (calc) — ABNORMAL HIGH (ref <130)
Total CHOL/HDL Ratio: 4.5 (calc) (ref <5.0)
Triglycerides: 120 mg/dL (ref <150)

## 2022-12-06 LAB — HEMOGLOBIN A1C
Hgb A1c MFr Bld: 5.9 % of total Hgb — ABNORMAL HIGH (ref <5.7)
Mean Plasma Glucose: 123 mg/dL
eAG (mmol/L): 6.8 mmol/L

## 2022-12-14 ENCOUNTER — Encounter: Payer: Self-pay | Admitting: Family Medicine

## 2022-12-14 ENCOUNTER — Other Ambulatory Visit: Payer: Self-pay | Admitting: Family Medicine

## 2022-12-14 DIAGNOSIS — Z1283 Encounter for screening for malignant neoplasm of skin: Secondary | ICD-10-CM

## 2022-12-14 DIAGNOSIS — D582 Other hemoglobinopathies: Secondary | ICD-10-CM

## 2022-12-21 DIAGNOSIS — L821 Other seborrheic keratosis: Secondary | ICD-10-CM | POA: Diagnosis not present

## 2022-12-21 DIAGNOSIS — L570 Actinic keratosis: Secondary | ICD-10-CM | POA: Diagnosis not present

## 2022-12-21 DIAGNOSIS — I781 Nevus, non-neoplastic: Secondary | ICD-10-CM | POA: Diagnosis not present

## 2022-12-21 DIAGNOSIS — L820 Inflamed seborrheic keratosis: Secondary | ICD-10-CM | POA: Diagnosis not present

## 2022-12-21 DIAGNOSIS — D225 Melanocytic nevi of trunk: Secondary | ICD-10-CM | POA: Diagnosis not present

## 2022-12-21 DIAGNOSIS — L814 Other melanin hyperpigmentation: Secondary | ICD-10-CM | POA: Diagnosis not present

## 2023-01-24 DIAGNOSIS — L821 Other seborrheic keratosis: Secondary | ICD-10-CM | POA: Diagnosis not present

## 2023-01-24 DIAGNOSIS — L538 Other specified erythematous conditions: Secondary | ICD-10-CM | POA: Diagnosis not present

## 2023-01-31 ENCOUNTER — Other Ambulatory Visit: Payer: Self-pay | Admitting: Family Medicine

## 2023-01-31 DIAGNOSIS — I10 Essential (primary) hypertension: Secondary | ICD-10-CM

## 2023-03-19 ENCOUNTER — Ambulatory Visit (INDEPENDENT_AMBULATORY_CARE_PROVIDER_SITE_OTHER): Payer: Medicare Other | Admitting: Family Medicine

## 2023-03-19 DIAGNOSIS — Z0000 Encounter for general adult medical examination without abnormal findings: Secondary | ICD-10-CM

## 2023-03-19 NOTE — Progress Notes (Signed)
MEDICARE ANNUAL WELLNESS VISIT  03/19/2023  Telephone Visit Disclaimer This Medicare AWV was conducted by telephone due to national recommendations for restrictions regarding the COVID-19 Pandemic (e.g. social distancing).  I verified, using two identifiers, that I am speaking with Matthew Hull or their authorized healthcare agent. I discussed the limitations, risks, security, and privacy concerns of performing an evaluation and management service by telephone and the potential availability of an in-person appointment in the future. The patient expressed understanding and agreed to proceed.  Location of Patient: Home Location of Provider (nurse):  In the office.  Subjective:    Matthew Hull is a 77 y.o. male patient of Everrett Coombe, DO who had a Medicare Annual Wellness Visit today via telephone. Matthew Hull is Retired and lives with their spouse. he has 5 children. he reports that he is socially active and does interact with friends/family regularly. 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  Patient Care Team: Everrett Coombe, DO as PCP - General (Family Medicine)     03/19/2023    8:06 AM 02/13/2022    1:09 PM 03/02/2019   10:12 AM 02/16/2014    9:17 AM  Advanced Directives  Does Patient Have a Medical Advance Directive? No No No Patient would not like information;Patient does not have advance directive  Would patient like information on creating a medical advance directive? No - Patient declined No - Patient declined No - Patient declined     Hospital Utilization Over the Past 12 Months: # of hospitalizations or ER visits: 0 # of surgeries: 0  Review of Systems    Patient reports that his overall health is unchanged compared to last year.  History obtained from chart review and the patient  Patient Reported Readings (BP, Pulse, CBG, Weight, etc) none  Pain Assessment Pain : 0-10 Pain Score: 6  Pain Type: Chronic pain Pain Location: Back Pain  Orientation: Right, Lower Pain Radiating Towards: thigh and calf Pain Descriptors / Indicators: Aching Pain Onset: More than a month ago Pain Frequency: Intermittent Pain Relieving Factors: rest  Pain Relieving Factors: rest  Current Medications & Allergies (verified) Allergies as of 03/19/2023       Reactions   Atorvastatin Other (See Comments)   cramping        Medication List        Accurate as of March 19, 2023  8:17 AM. If you have any questions, ask your nurse or doctor.          acetaminophen 650 MG CR tablet Commonly known as: TYLENOL Take 1 tablet (650 mg total) by mouth every 8 (eight) hours.   aspirin 81 MG tablet Take 81 mg by mouth daily.   atenolol-chlorthalidone 50-25 MG tablet Commonly known as: TENORETIC TAKE 0.5 TABLETS BY MOUTH 2 TIMES DAILY.   FISH OIL-KRILL OIL PO Take 1,000 mg by mouth.   KRILL OIL OMEGA-3 PO Take by mouth.   TURMERIC PO Take by mouth.        History (reviewed): Past Medical History:  Diagnosis Date   Cataract    bilateral   Mixed hyperlipidemia    Unspecified essential hypertension    unspecified   Past Surgical History:  Procedure Laterality Date   Curettage osteomyelitis focus; right middle finger     EYE SURGERY     I&D of skin and subcutaneous tissue, bone and nonviable, necrotic tissure, right middle finger     JOINT REPLACEMENT     Family History  Problem Relation Age of Onset   Throat cancer Father        throat/neck   Other Mother        pacemaker   Social History   Socioeconomic History   Marital status: Married    Spouse name: [name]   Number of children: 5   Years of education: 14   Highest education level: Associate degree: academic program  Occupational History   Occupation: Solicitor    Comment: retired  Tobacco Use   Smoking status: Never   Smokeless tobacco: Never  Vaping Use   Vaping Use: Never used  Substance and Sexual Activity   Alcohol use: Yes     Alcohol/week: 1.0 standard drink of alcohol    Types: 1 Glasses of wine per week    Comment: occasionally   Drug use: No   Sexual activity: Not Currently    Birth control/protection: Abstinence  Other Topics Concern   Not on file  Social History Narrative   Lives with his wife. He enjoys being Systems developer.   Social Determinants of Health   Financial Resource Strain: Medium Risk (03/15/2023)   Overall Financial Resource Strain (CARDIA)    Difficulty of Paying Living Expenses: Somewhat hard  Food Insecurity: No Food Insecurity (03/15/2023)   Hunger Vital Sign    Worried About Running Out of Food in the Last Year: Never true    Ran Out of Food in the Last Year: Never true  Transportation Needs: No Transportation Needs (03/15/2023)   PRAPARE - Administrator, Civil Service (Medical): No    Lack of Transportation (Non-Medical): No  Physical Activity: Insufficiently Active (03/15/2023)   Exercise Vital Sign    Days of Exercise per Week: 7 days    Minutes of Exercise per Session: 10 min  Stress: No Stress Concern Present (03/15/2023)   [employer] of Occupational Health - Occupational Stress Questionnaire    Feeling of Stress : Only a little  Social Connections: Socially Integrated (03/19/2023)   Social Connection and Isolation Panel [NHANES]    Frequency of Communication with Friends and Family: Twice a week    Frequency of Social Gatherings with Friends and Family: Once a week    Attends Religious Services: More than 4 times per year    Active Member of [employer] or Organizations: Yes    Attends Banker Meetings: More than 4 times per year    Marital Status: Married    Activities of Daily Living    03/15/2023    1:03 PM  In your present state of health, do you have any difficulty performing the following activities:  Hearing? 0  Vision? 0  Difficulty concentrating or making decisions? 0  Walking or climbing stairs? 0  Dressing or bathing? 0  Doing  errands, shopping? 0  Preparing Food and eating ? N  Using the Toilet? N  In the past six months, have you accidently leaked urine? N  Do you have problems with loss of bowel control? N  Managing your Medications? N  Managing your Finances? N  Housekeeping or managing your Housekeeping? N    Patient Education/ Literacy How often do you need to have someone help you when you read instructions, pamphlets, or other written materials from your doctor or pharmacy?: 2 - Rarely What is the last grade level you completed in school?: tech school  Exercise    Diet Patient reports consuming  1-2  meals a day and 0-1 snack(s) a  day Patient reports that his primary diet is: Regular Patient reports that she does have regular access to food.   Depression Screen    03/19/2023    8:07 AM 12/05/2022    2:45 PM 02/13/2022    1:10 PM 12/04/2021    2:28 PM 03/02/2019   10:13 AM 12/09/2018   10:34 AM 02/26/2018    2:55 PM  PHQ 2/9 Scores  PHQ - 2 Score 0 0 0 0 0 0 0  PHQ- 9 Score      2 0     Fall Risk    03/19/2023    8:07 AM 03/15/2023    1:03 PM 12/05/2022    2:45 PM 02/13/2022    1:10 PM 12/04/2021    2:28 PM  Fall Risk   Falls in the past year? 0 0 0 0 1  Number falls in past yr: 0  0 0 1  Injury with Fall? 0  0 0 0  Risk for fall due to : No Fall Risks  No Fall Risks No Fall Risks Impaired balance/gait;Impaired mobility  Follow up Falls evaluation completed  Falls evaluation completed Falls evaluation completed Falls evaluation completed     Objective:  Matthew Hull seemed alert and oriented and he participated appropriately during our telephone visit.  Blood Pressure Weight BMI  BP Readings from Last 3 Encounters:  12/05/22 (!) 145/77  09/03/22 (!) 178/93  02/05/22 (!) 158/82   Wt Readings from Last 3 Encounters:  12/05/22 230 lb (104.3 kg)  12/04/21 227 lb (103 kg)  06/14/20 246 lb (111.6 kg)   BMI Readings from Last 1 Encounters:  12/05/22 32.08 kg/m    *Unable to obtain  current vital signs, weight, and BMI due to telephone visit type  Hearing/Vision  Naheem did not seem to have difficulty with hearing/understanding during the telephone conversation Reports that he has had a formal eye exam by an eye care professional within the past year Reports that he has not had a formal hearing evaluation within the past year *Unable to fully assess hearing and vision during telephone visit type  Cognitive Function:    03/19/2023    8:11 AM 02/13/2022    1:15 PM 03/02/2019   10:18 AM  6CIT Screen  What Year? 0 points 0 points 0 points  What month? 0 points 0 points 0 points  What time? 0 points 0 points 0 points  Count back from 20 0 points 0 points 0 points  Months in reverse 0 points 2 points 0 points  Repeat phrase 0 points 0 points 0 points  Total Score 0 points 2 points 0 points   (Normal:0-7, Significant for Dysfunction: >8)  Normal Cognitive Function Screening: Yes   Immunization & Health Maintenance Record Immunization History  Administered Date(s) Administered   Fluad Quad(high Dose 65+) 06/11/2019, 06/14/2020   Influenza, High Dose Seasonal PF 08/28/2017, 09/01/2018   Influenza-Unspecified 07/01/2014   Pneumococcal Conjugate-13 03/20/2016   Pneumococcal Polysaccharide-23 02/26/2018   Zoster Recombinat (Shingrix) 04/22/2020, 05/02/2021    Health Maintenance  Topic Date Due   DTaP/Tdap/Td (1 - Tdap) Never done   COVID-19 Vaccine (1) 06/02/2023 (Originally 11/26/1946)   Hepatitis C Screening  03/18/2024 (Originally 05/26/1964)   INFLUENZA VACCINE  05/02/2023   Medicare Annual Wellness (AWV)  03/18/2024   Pneumonia Vaccine 45+ Years old  Completed   Zoster Vaccines- Shingrix  Completed   HPV VACCINES  Aged Out   Colonoscopy  Discontinued  Assessment  This is a routine wellness examination for Matthew Hull.  Health Maintenance: Due or Overdue Health Maintenance Due  Topic Date Due   DTaP/Tdap/Td (1 - Tdap) Never done    Matthew Hull does not need a referral for Community Assistance: Care Management:   no Social Work:    no Prescription Assistance:  no Nutrition/Diabetes Education:  no   Plan:  Personalized Goals  Goals Addressed               This Visit's Progress     Patient Stated (pt-stated)        Patient stated that he would like to continue to be able to be active.        Personalized Health Maintenance & Screening Recommendations  Td vaccine  Lung Cancer Screening Recommended: no (Low Dose CT Chest recommended if Age 38-80 years, 20 pack-year currently smoking OR have quit w/in past 15 years) Hepatitis C Screening recommended: yes HIV Screening recommended: no  Advanced Directives: Written information was not prepared per patient's request.  Referrals & Orders No orders of the defined types were placed in this encounter.   Follow-up Plan Follow-up with Everrett Coombe, DO as planned Schedule td vaccine at the pharmacy. Medicare wellness visit in one year.  Patient will access AVS on my chart.   I have personally reviewed and noted the following in the patient's chart:   Medical and social history Use of alcohol, tobacco or illicit drugs  Current medications and supplements Functional ability and status Nutritional status Physical activity Advanced directives List of other physicians Hospitalizations, surgeries, and ER visits in previous 12 months Vitals Screenings to include cognitive, depression, and falls Referrals and appointments  In addition, I have reviewed and discussed with Matthew Hull certain preventive protocols, quality metrics, and best practice recommendations. A written personalized care plan for preventive services as well as general preventive health recommendations is available and can be mailed to the patient at his request.      Modesto Charon, RN BSN  03/19/2023

## 2023-03-19 NOTE — Patient Instructions (Addendum)
MEDICARE ANNUAL WELLNESS VISIT Health Maintenance Summary and Written Plan of Care  Mr. Matthew Hull ,  Thank you for allowing me to perform your Medicare Annual Wellness Visit and for your ongoing commitment to your health.   Health Maintenance & Immunization History Health Maintenance  Topic Date Due   DTaP/Tdap/Td (1 - Tdap) Never done   COVID-19 Vaccine (1) 06/02/2023 (Originally 11/26/1946)   Hepatitis C Screening  03/18/2024 (Originally 05/26/1964)   INFLUENZA VACCINE  05/02/2023   Medicare Annual Wellness (AWV)  03/18/2024   Pneumonia Vaccine 85+ Years old  Completed   Zoster Vaccines- Shingrix  Completed   HPV VACCINES  Aged Out   Colonoscopy  Discontinued   Immunization History  Administered Date(s) Administered   Fluad Quad(high Dose 65+) 06/11/2019, 06/14/2020   Influenza, High Dose Seasonal PF 08/28/2017, 09/01/2018   Influenza-Unspecified 07/01/2014   Pneumococcal Conjugate-13 03/20/2016   Pneumococcal Polysaccharide-23 02/26/2018   Zoster Recombinat (Shingrix) 04/22/2020, 05/02/2021    These are the patient goals that we discussed:  Goals Addressed               This Visit's Progress     Patient Stated (pt-stated)        Patient stated that he would like to continue to be able to be active.          This is a list of Health Maintenance Items that are overdue or due now: Health Maintenance Due  Topic Date Due   DTaP/Tdap/Td (1 - Tdap) Never done     Orders/Referrals Placed Today: No orders of the defined types were placed in this encounter.  (Contact our referral department at [phone] if you have not spoken with someone about your referral appointment within the next 5 days)    Follow-up Plan Follow-up with Everrett Coombe, DO as planned Schedule td vaccine at the pharmacy. Medicare wellness visit in one year.  Patient will access AVS on my chart.      Health Maintenance, Male Adopting a healthy lifestyle and getting preventive care are  important in promoting health and wellness. Ask your health care provider about: The right schedule for you to have regular tests and exams. Things you can do on your own to prevent diseases and keep yourself healthy. What should I know about diet, weight, and exercise? Eat a healthy diet  Eat a diet that includes plenty of vegetables, fruits, low-fat dairy products, and lean protein. Do not eat a lot of foods that are high in solid fats, added sugars, or sodium. Maintain a healthy weight Body mass index (BMI) is a measurement that can be used to identify possible weight problems. It estimates body fat based on height and weight. Your health care provider can help determine your BMI and help you achieve or maintain a healthy weight. Get regular exercise Get regular exercise. This is one of the most important things you can do for your health. Most adults should: Exercise for at least 150 minutes each week. The exercise should increase your heart rate and make you sweat (moderate-intensity exercise). Do strengthening exercises at least twice a week. This is in addition to the moderate-intensity exercise. Spend less time sitting. Even light physical activity can be beneficial. Watch cholesterol and blood lipids Have your blood tested for lipids and cholesterol at 77 years of age, then have this test every 5 years. You may need to have your cholesterol levels checked more often if: Your lipid or cholesterol levels are high. You are older than  77 years of age. You are at high risk for heart disease. What should I know about cancer screening? Many types of cancers can be detected early and may often be prevented. Depending on your health history and family history, you may need to have cancer screening at various ages. This may include screening for: Colorectal cancer. Prostate cancer. Skin cancer. Lung cancer. What should I know about heart disease, diabetes, and high blood pressure? Blood  pressure and heart disease High blood pressure causes heart disease and increases the risk of stroke. This is more likely to develop in people who have high blood pressure readings or are overweight. Talk with your health care provider about your target blood pressure readings. Have your blood pressure checked: Every 3-5 years if you are 37-22 years of age. Every year if you are 43 years old or older. If you are between the ages of 61 and 46 and are a current or former smoker, ask your health care provider if you should have a one-time screening for abdominal aortic aneurysm (AAA). Diabetes Have regular diabetes screenings. This checks your fasting blood sugar level. Have the screening done: Once every three years after age 18 if you are at a normal weight and have a low risk for diabetes. More often and at a younger age if you are overweight or have a high risk for diabetes. What should I know about preventing infection? Hepatitis B If you have a higher risk for hepatitis B, you should be screened for this virus. Talk with your health care provider to find out if you are at risk for hepatitis B infection. Hepatitis C Blood testing is recommended for: Everyone born from [birth year] through 1965. Anyone with known risk factors for hepatitis C. Sexually transmitted infections (STIs) You should be screened each year for STIs, including gonorrhea and chlamydia, if: You are sexually active and are younger than 77 years of age. You are older than 77 years of age and your health care provider tells you that you are at risk for this type of infection. Your sexual activity has changed since you were last screened, and you are at increased risk for chlamydia or gonorrhea. Ask your health care provider if you are at risk. Ask your health care provider about whether you are at high risk for HIV. Your health care provider may recommend a prescription medicine to help prevent HIV infection. If you choose to take  medicine to prevent HIV, you should first get tested for HIV. You should then be tested every 3 months for as long as you are taking the medicine. Follow these instructions at home: Alcohol use Do not drink alcohol if your health care provider tells you not to drink. If you drink alcohol: Limit how much you have to 0-2 drinks a day. Know how much alcohol is in your drink. In the [place] one drink equals one 12 oz bottle of beer (355 mL), one 5 oz glass of wine (148 mL), or one 1 oz glass of hard liquor (44 mL). Lifestyle Do not use any products that contain nicotine or tobacco. These products include cigarettes, chewing tobacco, and vaping devices, such as e-cigarettes. If you need help quitting, ask your health care provider. Do not use street drugs. Do not share needles. Ask your health care provider for help if you need support or information about quitting drugs. General instructions Schedule regular health, dental, and eye exams. Stay current with your vaccines. Tell your health care provider if: You  often feel depressed. You have ever been abused or do not feel safe at home. Summary Adopting a healthy lifestyle and getting preventive care are important in promoting health and wellness. Follow your health care provider's instructions about healthy diet, exercising, and getting tested or screened for diseases. Follow your health care provider's instructions on monitoring your cholesterol and blood pressure. This information is not intended to replace advice given to you by your health care provider. Make sure you discuss any questions you have with your health care provider. Document Revised: 02/06/2021 Document Reviewed: 02/06/2021 Elsevier Patient Education  2024 [employer].

## 2023-08-21 DIAGNOSIS — H04123 Dry eye syndrome of bilateral lacrimal glands: Secondary | ICD-10-CM | POA: Diagnosis not present

## 2023-08-21 DIAGNOSIS — H35362 Drusen (degenerative) of macula, left eye: Secondary | ICD-10-CM | POA: Diagnosis not present

## 2023-09-06 DIAGNOSIS — H43811 Vitreous degeneration, right eye: Secondary | ICD-10-CM | POA: Diagnosis not present

## 2023-09-06 DIAGNOSIS — H353132 Nonexudative age-related macular degeneration, bilateral, intermediate dry stage: Secondary | ICD-10-CM | POA: Diagnosis not present

## 2023-09-06 DIAGNOSIS — H35372 Puckering of macula, left eye: Secondary | ICD-10-CM | POA: Diagnosis not present

## 2023-10-22 ENCOUNTER — Ambulatory Visit (INDEPENDENT_AMBULATORY_CARE_PROVIDER_SITE_OTHER): Payer: Medicare Other | Admitting: Family Medicine

## 2023-10-22 VITALS — BP 151/75 | HR 67 | Ht 71.0 in

## 2023-10-22 DIAGNOSIS — R7303 Prediabetes: Secondary | ICD-10-CM | POA: Diagnosis not present

## 2023-10-22 DIAGNOSIS — E785 Hyperlipidemia, unspecified: Secondary | ICD-10-CM | POA: Diagnosis not present

## 2023-10-22 DIAGNOSIS — I10 Essential (primary) hypertension: Secondary | ICD-10-CM

## 2023-10-22 MED ORDER — ATENOLOL-CHLORTHALIDONE 50-25 MG PO TABS: 1.0000 | ORAL_TABLET | Freq: Every day | ORAL | 3 refills | Status: DC

## 2023-10-22 NOTE — Assessment & Plan Note (Signed)
Update A1c ?

## 2023-10-22 NOTE — Assessment & Plan Note (Signed)
BP is fairly well controlled.  Continue current medication at current strength.

## 2023-10-22 NOTE — Assessment & Plan Note (Signed)
Intolerant to atorvastatin in the past.  Update lipid panel.

## 2023-10-22 NOTE — Progress Notes (Signed)
Matthew Hull - 78 y.o. male MRN [id number]  Date of birth: 1946-01-27  Subjective Chief Complaint  Patient presents with   Hypertension    HPI Matthew Hull is a 78 y.o. male here today for follow up visit.    He reports that he is doing quite well.  He remains on atenolol/chlorthalidone for management of HTN.  BP is pretty well controlled at home.  He denies side effects from medication.  He has not had chest pain, shortness of breath, palpitations, headache or vision changes.   ROS:  A comprehensive ROS was completed and negative except as noted per HPI  No Active Allergies  Past Medical History:  Diagnosis Date   Cataract    bilateral   Mixed hyperlipidemia    Unspecified essential hypertension    unspecified    Past Surgical History:  Procedure Laterality Date   Curettage osteomyelitis focus; right middle finger     EYE SURGERY     I&D of skin and subcutaneous tissue, bone and nonviable, necrotic tissure, right middle finger     JOINT REPLACEMENT      Social History   Socioeconomic History   Marital status: Married    Spouse name: [name]   Number of children: 5   Years of education: 14   Highest education level: Associate degree: occupational, Scientist, product/process development, or vocational program  Occupational History   Occupation: Solicitor    Comment: retired  Tobacco Use   Smoking status: Never   Smokeless tobacco: Never  Vaping Use   Vaping status: Never Used  Substance and Sexual Activity   Alcohol use: Yes    Alcohol/week: 1.0 standard drink of alcohol    Types: 1 Glasses of wine per week    Comment: occasionally   Drug use: No   Sexual activity: Not Currently    Birth control/protection: Abstinence  Other Topics Concern   Not on file  Social History Narrative   Lives with his wife. He enjoys being Systems developer.   Social Drivers of Corporate investment banker Strain: Low Risk  (10/22/2023)   Overall Financial Resource Strain (CARDIA)    Difficulty of  Paying Living Expenses: Not very hard  Food Insecurity: No Food Insecurity (10/22/2023)   Hunger Vital Sign    Worried About Running Out of Food in the Last Year: Never true    Ran Out of Food in the Last Year: Never true  Transportation Needs: No Transportation Needs (10/22/2023)   PRAPARE - Administrator, Civil Service (Medical): No    Lack of Transportation (Non-Medical): No  Physical Activity: Inactive (10/22/2023)   Exercise Vital Sign    Days of Exercise per Week: 0 days    Minutes of Exercise per Session: 10 min  Stress: No Stress Concern Present (10/22/2023)   [employer] of Occupational Health - Occupational Stress Questionnaire    Feeling of Stress : Only a little  Social Connections: Socially Integrated (10/22/2023)   Social Connection and Isolation Panel [NHANES]    Frequency of Communication with Friends and Family: Twice a week    Frequency of Social Gatherings with Friends and Family: Once a week    Attends Religious Services: More than 4 times per year    Active Member of [employer] or Organizations: Yes    Attends Engineer, structural: More than 4 times per year    Marital Status: Married    Family History  Problem Relation Age of Onset  Throat cancer Father        throat/neck   Other Mother        pacemaker    Health Maintenance  Topic Date Due   DTaP/Tdap/Td (1 - Tdap) Never done   COVID-19 Vaccine (1 - 2024-25 season) Never done   INFLUENZA VACCINE  12/30/2023 (Originally 05/02/2023)   Hepatitis C Screening  03/18/2024 (Originally 05/26/1964)   Medicare Annual Wellness (AWV)  03/18/2024   Pneumonia Vaccine 69+ Years old  Completed   Zoster Vaccines- Shingrix  Completed   HPV VACCINES  Aged Out   Colonoscopy  Discontinued      ----------------------------------------------------------------------------------------------------------------------------------------------------------------------------------------------------------------- Physical Exam BP (!) 151/75   Pulse 67   Ht 5\' 11"  (1.803 m)   SpO2 97%   BMI 32.08 kg/m   Physical Exam Constitutional:      Appearance: Normal appearance.  Eyes:     General: No scleral icterus. Cardiovascular:     Rate and Rhythm: Normal rate and regular rhythm.  Pulmonary:     Effort: Pulmonary effort is normal.     Breath sounds: Normal breath sounds.  Neurological:     Mental Status: He is alert.  Psychiatric:        Mood and Affect: Mood normal.        Behavior: Behavior normal.     ------------------------------------------------------------------------------------------------------------------------------------------------------------------------------------------------------------------- Assessment and Plan  Essential hypertension BP is fairly well controlled.  Continue current medication at current strength.    Hyperlipidemia Intolerant to atorvastatin in the past.  Update lipid panel.   Prediabetes Update A1c    Meds ordered this encounter  Medications   atenolol-chlorthalidone (TENORETIC) 50-25 MG tablet    Sig: Take 1 tablet by mouth daily.    Dispense:  90 tablet    Refill:  3    Return in about 6 months (around 04/20/2024) for Hypertension.    This visit occurred during the SARS-CoV-2 public health emergency.  Safety protocols were in place, including screening questions prior to the visit, additional usage of staff PPE, and extensive cleaning of exam room while observing appropriate contact time as indicated for disinfecting solutions.

## 2023-10-29 DIAGNOSIS — I10 Essential (primary) hypertension: Secondary | ICD-10-CM | POA: Diagnosis not present

## 2023-10-29 DIAGNOSIS — R7303 Prediabetes: Secondary | ICD-10-CM | POA: Diagnosis not present

## 2023-10-29 DIAGNOSIS — E785 Hyperlipidemia, unspecified: Secondary | ICD-10-CM | POA: Diagnosis not present

## 2023-10-30 LAB — HEMOGLOBIN A1C
Est. average glucose Bld gHb Est-mCnc: 120 mg/dL
Hgb A1c MFr Bld: 5.8 % — ABNORMAL HIGH (ref 4.8–5.6)

## 2023-10-30 LAB — CMP14+EGFR
ALT: 41 [IU]/L (ref 0–44)
AST: 26 [IU]/L (ref 0–40)
Albumin: 4.4 g/dL (ref 3.8–4.8)
Alkaline Phosphatase: 71 [IU]/L (ref 44–121)
BUN/Creatinine Ratio: 35 — ABNORMAL HIGH (ref 10–24)
BUN: 33 mg/dL — ABNORMAL HIGH (ref 8–27)
Bilirubin Total: 0.5 mg/dL (ref 0.0–1.2)
CO2: 24 mmol/L (ref 20–29)
Calcium: 9.9 mg/dL (ref 8.6–10.2)
Chloride: 101 mmol/L (ref 96–106)
Creatinine, Ser: 0.94 mg/dL (ref 0.76–1.27)
Globulin, Total: 2 g/dL (ref 1.5–4.5)
Glucose: 101 mg/dL — ABNORMAL HIGH (ref 70–99)
Potassium: 3.9 mmol/L (ref 3.5–5.2)
Sodium: 140 mmol/L (ref 134–144)
Total Protein: 6.4 g/dL (ref 6.0–8.5)
eGFR: 83 mL/min/{1.73_m2} (ref >59)

## 2023-10-30 LAB — CBC WITH DIFFERENTIAL/PLATELET
Basophils Absolute: 0 10*3/uL (ref 0.0–0.2)
Basos: 1 %
EOS (ABSOLUTE): 0.1 10*3/uL (ref 0.0–0.4)
Eos: 2 %
Hematocrit: 49.2 % (ref 37.5–51.0)
Hemoglobin: 16.8 g/dL (ref 13.0–17.7)
Immature Grans (Abs): 0 10*3/uL (ref 0.0–0.1)
Immature Granulocytes: 0 %
Lymphocytes Absolute: 2 10*3/uL (ref 0.7–3.1)
Lymphs: 28 %
MCH: 31.5 pg (ref 26.6–33.0)
MCHC: 34.1 g/dL (ref 31.5–35.7)
MCV: 92 fL (ref 79–97)
Monocytes Absolute: 0.6 10*3/uL (ref 0.1–0.9)
Monocytes: 8 %
Neutrophils Absolute: 4.5 10*3/uL (ref 1.4–7.0)
Neutrophils: 61 %
Platelets: 233 10*3/uL (ref 150–450)
RBC: 5.33 x10E6/uL (ref 4.14–5.80)
RDW: 12.3 % (ref 11.6–15.4)
WBC: 7.3 10*3/uL (ref 3.4–10.8)

## 2023-10-30 LAB — LIPID PANEL WITH LDL/HDL RATIO
Cholesterol, Total: 226 mg/dL — ABNORMAL HIGH (ref 100–199)
HDL: 48 mg/dL (ref >39)
LDL Chol Calc (NIH): 153 mg/dL — ABNORMAL HIGH (ref 0–99)
LDL/HDL Ratio: 3.2 {ratio} (ref 0.0–3.6)
Triglycerides: 139 mg/dL (ref 0–149)
VLDL Cholesterol Cal: 25 mg/dL (ref 5–40)

## 2023-11-07 IMAGING — MR MR LUMBAR SPINE W/O CM
4 of 5 series · 25 of 48 positions shown · non-contrast
Comparison: Radiographs of the lumbar spine 11/01/2021.

CLINICAL DATA: Low back pain, symptoms persist with greater than 6
weeks treatment. Right-sided lumbar radiculopathy, L4-5 L5
spondylolisthesis, epidural planning.

EXAM:
MRI LUMBAR SPINE WITHOUT CONTRAST
TECHNIQUE: Multiplanar, multisequence MR imaging of the lumbar spine was
performed. No intravenous contrast was administered.

[Series 2: T2 · sagittal · 4.0mm · 0.81mm/px · 6 of 15 slices shown (1 of 2)]
[im 1/15]
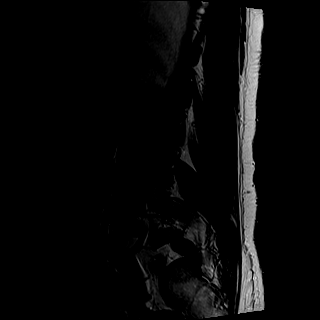
[im 3/15]
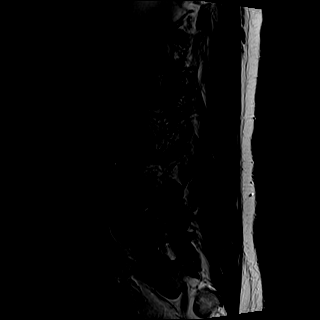
[im 6/15]
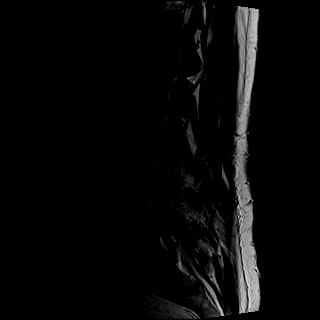
[im 9/15]
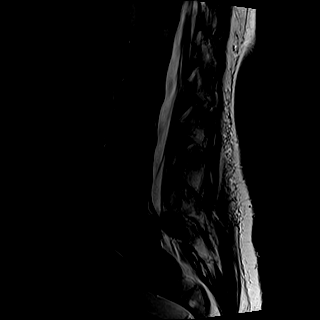
[im 12/15]
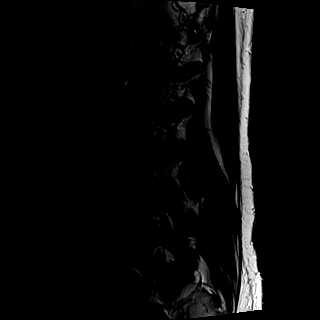
[im 15/15]
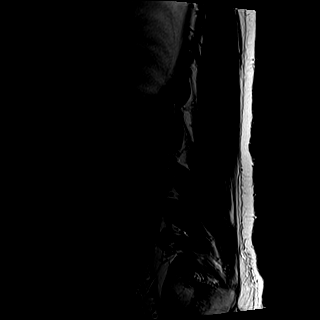

[Series 3: T1 · sagittal · 4.0mm · 0.41mm/px · 5 of 15 slices shown (1 of 2)]
[im 1/15]
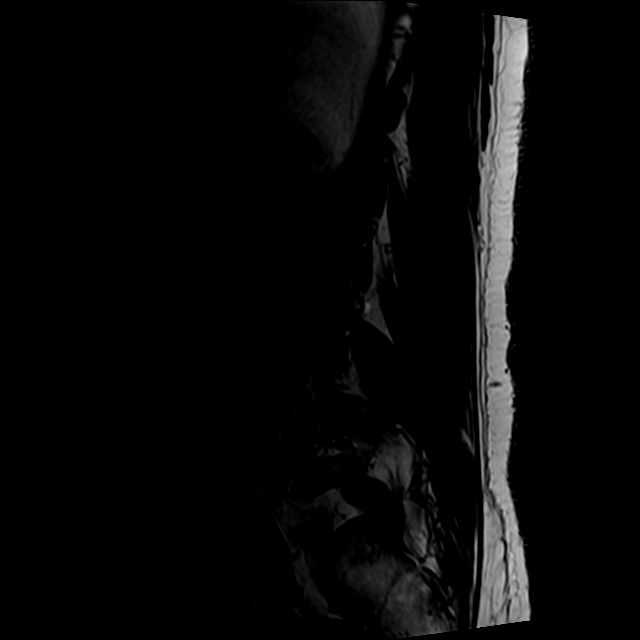
[im 4/15]
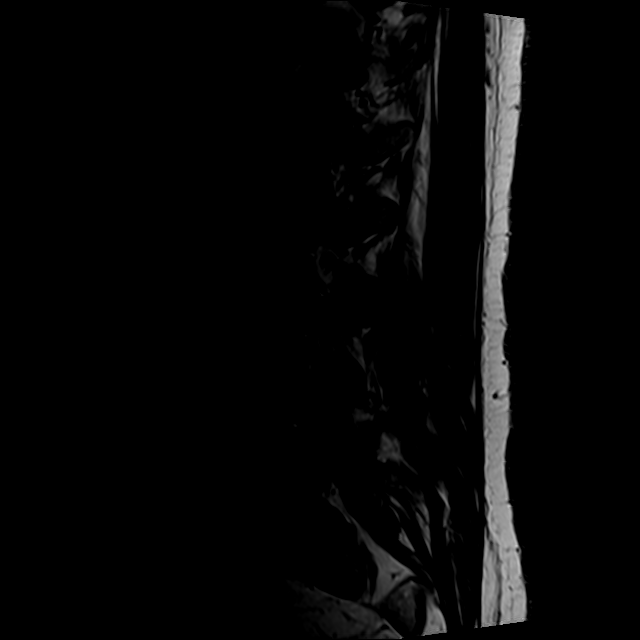
[im 8/15]
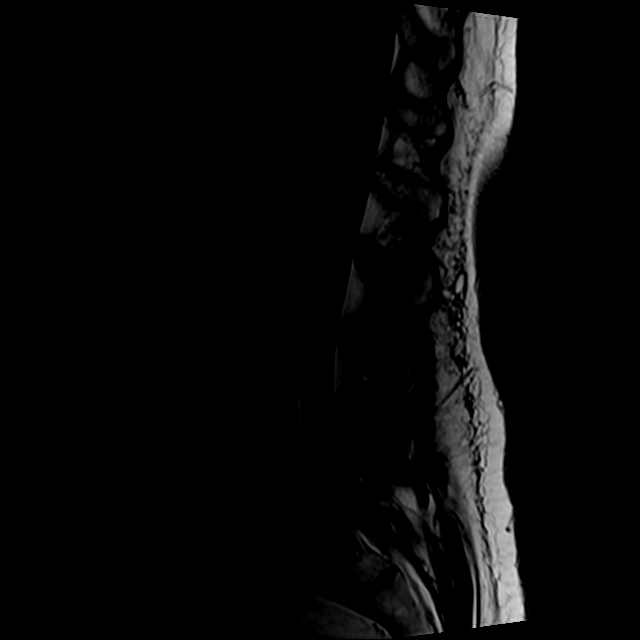
[im 11/15]
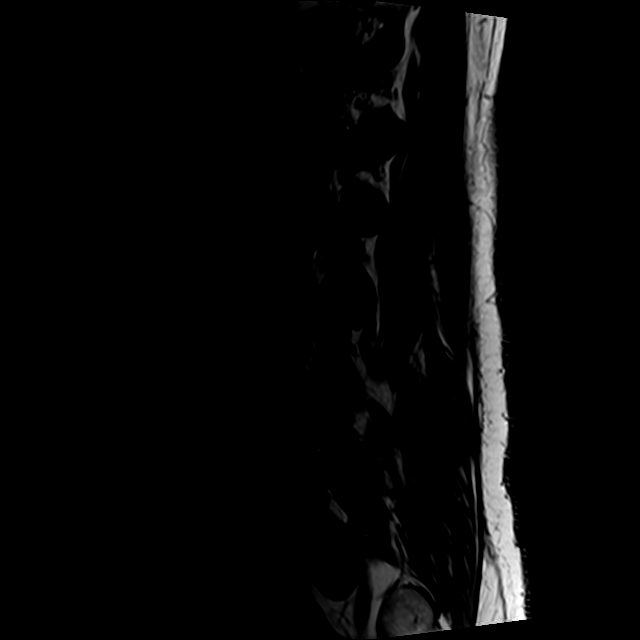
[im 15/15]
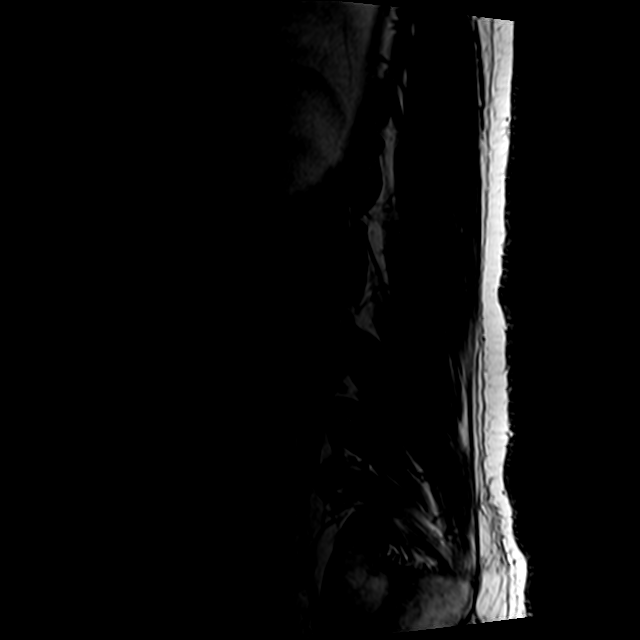

[Series 5: T2 · axial · 4.0mm · 0.78mm/px · z∈[-119,+111]mm · 10 of 47 slices shown (2 of 2)]
[im 4/47]
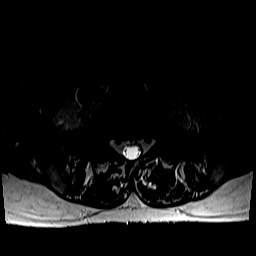
[im 7/47]
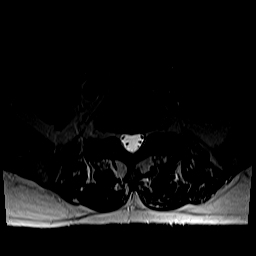
[im 10/47]
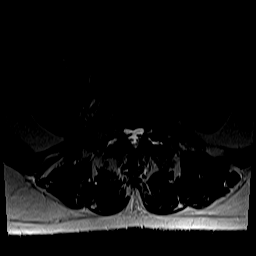
[im 16/47]
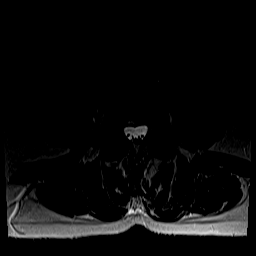
[im 22/47]
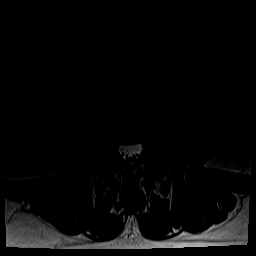
[im 25/47]
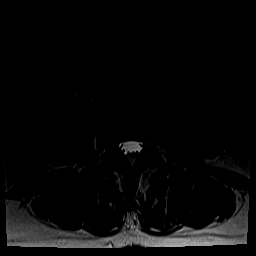
[im 28/47]
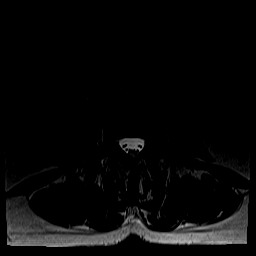
[im 34/47]
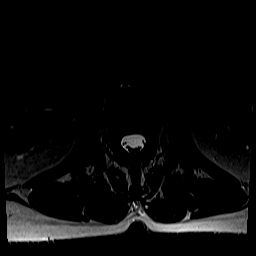
[im 40/47]
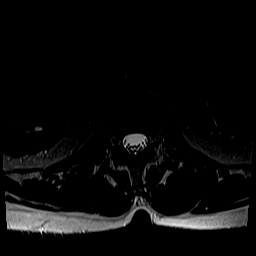
[im 47/47]
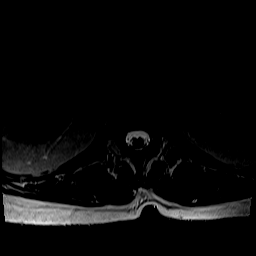

[Series 6: T1 · axial · 4.0mm · 0.39mm/px · z∈[-119,+77]mm · 4 of 47 slices shown (2 of 2)]
[im 4/47]
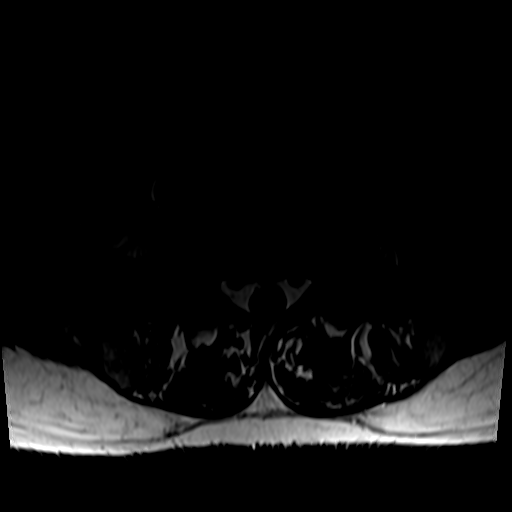
[im 7/47]
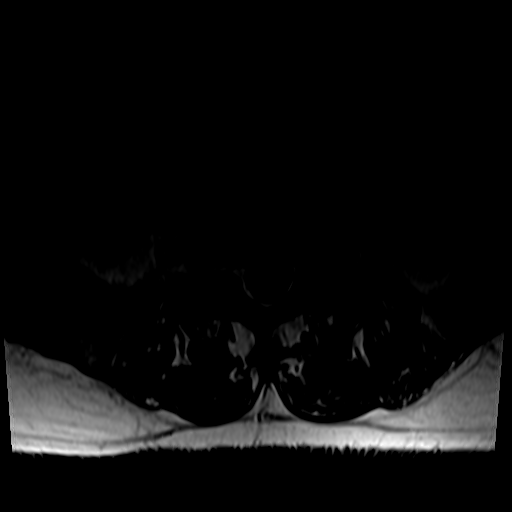
[im 25/47]
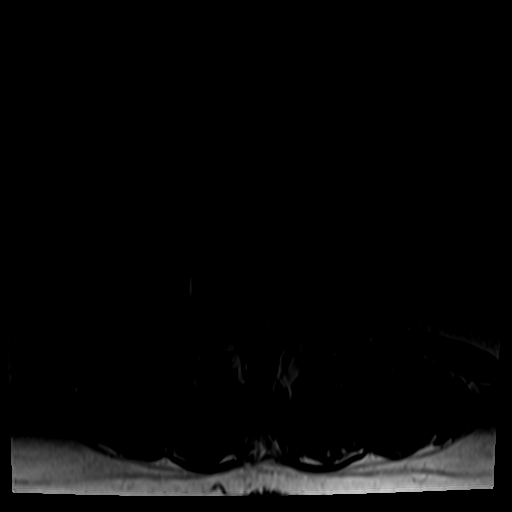
[im 40/47]
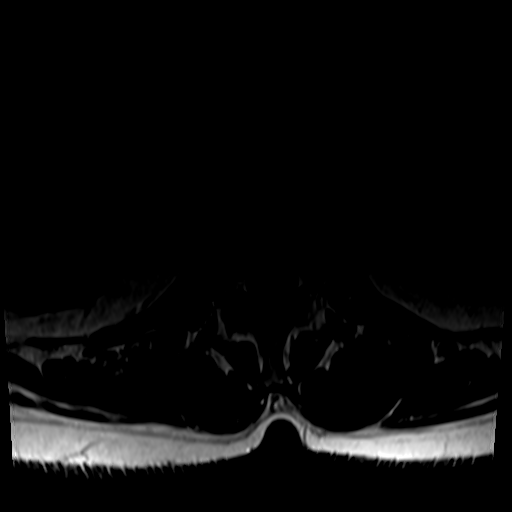

[25 of 48 positions shown; findings below may reference images not displayed]

FINDINGS: Segmentation: 5 lumbar vertebrae. The caudal most well-formed
intervertebral disc space is designated L5-S1.

Alignment: Trace L1-L2 grade 1 retrolisthesis. 6 mm L4-L5 grade 1
anterolisthesis.

Vertebrae: Vertebral body height is maintained. No significant
marrow edema or focal suspicious osseous lesions. Multilevel
vertebral body hemangiomas.

Conus medullaris and cauda equina: Conus extends to the L1-L2 level.
Incompletely imaged T2 hyperintense signal abnormality is questioned
within the distal aspect of the spinal cord at the T11 level (series
2, image 8).

Paraspinal and other soft tissues: No abnormality identified within
included portions of the abdomen/retroperitoneum. Paraspinal soft
tissues unremarkable.

Disc levels:

Mild-to-moderate disc degeneration at L4-L5. No more than mild disc
degeneration at the remaining lumbar or visualized lower thoracic
levels.

T11-T12: Imaged sagittally. No significant disc herniation or
stenosis.

T12-L1: Minimal facet arthrosis. No significant disc herniation or
stenosis.

L1-L2: Trace grade 1 retrolisthesis. Disc bulge. Minimal facet
arthrosis on the right. No significant spinal canal or foraminal
stenosis.

L2-L3: Small right foraminal annular fissure. A small left foraminal
annular fissure is also questioned. Small disc bulge. Facet
arthrosis/ligamentum flavum hypertrophy. Trace right facet joint
effusion. Minimal relative right subarticular narrowing (without
nerve root impingement). Central canal patent. No significant
foraminal stenosis.

L3-L4: Disc bulge. Facet arthrosis/ligamentum flavum hypertrophy. No
significant spinal canal or foraminal stenosis.

L4-L5: 6 mm grade 1 anterolisthesis. Disc uncovering with disc
bulge. Superimposed broad-based right center/subarticular disc
protrusion at site of posterior annular fissure (series 2, image 7).
Advanced facet arthrosis with ligamentum flavum hypertrophy. Small
bilateral facet joint effusions. Bilateral subarticular stenosis
with medialization of the descending L5 nerve roots. Moderate
central canal stenosis. Moderate bilateral neural foraminal
narrowing (greater on the right).

L5-S1: Moderate facet moderate/advanced facet arthrosis. No
significant disc herniation or stenosis.

Impression #1 will be called to the ordering clinician or
representative by the Radiologist Assistant, and communication
documented in the PACS or [REDACTED].
IMPRESSION: Incompletely imaged signal abnormality (versus artifact) within the
distal spinal cord at the T12 level (at the very periphery of the
field of view). A thoracic spine MRI is recommended for further
evaluation.

Lumbar spondylosis, as outlined and with findings most notably as
follows.

At L4-L5, there is mild-to-moderate disc degeneration. 6 mm grade 1
anterolisthesis. Disc uncovering with disc bulge. Superimposed
broad-based right center/subarticular disc protrusion at site of
posterior annular fissure. Advanced facet arthrosis with ligamentum
flavum hypertrophy. Small bilateral facet joint effusions. Bilateral
subarticular stenosis with medialization of the bilateral descending
L5 nerve roots. Moderate central canal stenosis. Moderate bilateral
neural foraminal narrowing (greater on the right).

No more than mild relative spinal canal narrowing, and no
significant foraminal stenosis, at the remaining levels.

Also of note, facet arthrosis is moderate/advanced at L5-S1.

## 2023-11-08 ENCOUNTER — Encounter: Payer: Self-pay | Admitting: Family Medicine

## 2023-12-09 ENCOUNTER — Ambulatory Visit: Payer: Medicare Other | Admitting: Family Medicine

## 2023-12-26 DIAGNOSIS — D225 Melanocytic nevi of trunk: Secondary | ICD-10-CM | POA: Diagnosis not present

## 2023-12-26 DIAGNOSIS — I788 Other diseases of capillaries: Secondary | ICD-10-CM | POA: Diagnosis not present

## 2023-12-26 DIAGNOSIS — I781 Nevus, non-neoplastic: Secondary | ICD-10-CM | POA: Diagnosis not present

## 2023-12-26 DIAGNOSIS — L821 Other seborrheic keratosis: Secondary | ICD-10-CM | POA: Diagnosis not present

## 2023-12-26 DIAGNOSIS — L814 Other melanin hyperpigmentation: Secondary | ICD-10-CM | POA: Diagnosis not present

## 2024-03-24 ENCOUNTER — Ambulatory Visit: Payer: Medicare Other

## 2024-03-24 VITALS — Ht 71.5 in | Wt 220.0 lb

## 2024-03-24 DIAGNOSIS — Z0000 Encounter for general adult medical examination without abnormal findings: Secondary | ICD-10-CM | POA: Diagnosis not present

## 2024-03-24 NOTE — Progress Notes (Signed)
 Subjective:   Matthew Hull is a 78 y.o. male who presents for Medicare Annual/Subsequent preventive examination.  Visit Complete: Virtual I connected with  Matthew Hull on 03/24/24 by a audio enabled telemedicine application and verified that I am speaking with the correct person using two identifiers.  Patient Location: Home  Provider Location: Office/Clinic  I discussed the limitations of evaluation and management by telemedicine. The patient expressed understanding and agreed to proceed.  Vital Signs: Because this visit was a virtual/telehealth visit, some criteria may be missing or patient reported. Any vitals not documented were not able to be obtained and vitals that have been documented are patient reported.  Patient Medicare AWV questionnaire was completed by the patient on 03/23/2024; I have confirmed that all information answered by patient is correct and no changes since this date.  Cardiac Risk Factors include: advanced age (>68men, >20 women);male gender;hypertension;obesity (BMI >30kg/m2);dyslipidemia     Objective:    Today's Vitals   03/24/24 0758  Weight: 220 lb (99.8 kg)  Height: 5' 11.5 (1.816 m)   Body mass index is 30.26 kg/m.     03/24/2024    8:06 AM 03/19/2023    8:06 AM 02/13/2022    1:09 PM 03/02/2019   10:12 AM 02/16/2014    9:17 AM  Advanced Directives  Does Patient Have a Medical Advance Directive? No No No No Patient would not like information;Patient does not have advance directive   Would patient like information on creating a medical advance directive? No - Patient declined No - Patient declined No - Patient declined No - Patient declined       Data saved with a previous flowsheet row definition    Current Medications (verified) Outpatient Encounter Medications as of 03/24/2024  Medication Sig   acetaminophen  (TYLENOL ) 650 MG CR tablet Take 1 tablet (650 mg total) by mouth every 8 (eight) hours.   aspirin 81 MG tablet Take 81 mg by mouth  daily.     atenolol -chlorthalidone  (TENORETIC ) 50-25 MG tablet Take 1 tablet by mouth daily.   FISH OIL-KRILL OIL PO Take 1,000 mg by mouth.   KRILL OIL OMEGA-3 PO Take by mouth.   TURMERIC PO Take by mouth.   Multiple Vitamins-Minerals (PRESERVISION AREDS) CAPS Take by mouth. (Patient not taking: Reported on 03/24/2024)   No facility-administered encounter medications on file as of 03/24/2024.    Allergies (verified) Patient has no active allergies.   History: Past Medical History:  Diagnosis Date   Cataract    bilateral   Mixed hyperlipidemia    Unspecified essential hypertension    unspecified   Past Surgical History:  Procedure Laterality Date   Curettage osteomyelitis focus; right middle finger     EYE SURGERY     I&D of skin and subcutaneous tissue, bone and nonviable, necrotic tissure, right middle finger     JOINT REPLACEMENT     Family History  Problem Relation Age of Onset   Throat cancer Father        throat/neck   Other Mother        pacemaker   Social History   Socioeconomic History   Marital status: Married    Spouse name: [name]   Number of children: 5   Years of education: 14   Highest education level: Associate degree: occupational, Scientist, product/process development, or vocational program  Occupational History   Occupation: Solicitor    Comment: retired  Tobacco Use   Smoking status: Never   Smokeless tobacco:  Never  Vaping Use   Vaping status: Never Used  Substance and Sexual Activity   Alcohol use: Yes    Alcohol/week: 1.0 standard drink of alcohol    Types: 1 Glasses of wine per week    Comment: occasionally   Drug use: No   Sexual activity: Not Currently    Birth control/protection: Abstinence  Other Topics Concern   Not on file  Social History Narrative   Lives with his wife. He enjoys being Systems developer.   Social Drivers of Corporate investment banker Strain: Low Risk  (03/24/2024)   Overall Financial Resource Strain (CARDIA)    Difficulty of  Paying Living Expenses: Not hard at all  Food Insecurity: No Food Insecurity (03/24/2024)   Hunger Vital Sign    Worried About Running Out of Food in the Last Year: Never true    Ran Out of Food in the Last Year: Never true  Transportation Needs: No Transportation Needs (03/24/2024)   PRAPARE - Administrator, Civil Service (Medical): No    Lack of Transportation (Non-Medical): No  Physical Activity: Insufficiently Active (03/24/2024)   Exercise Vital Sign    Days of Exercise per Week: 3 days    Minutes of Exercise per Session: 20 min  Stress: No Stress Concern Present (03/24/2024)   [employer] of Occupational Health - Occupational Stress Questionnaire    Feeling of Stress: Not at all  Social Connections: Socially Integrated (03/24/2024)   Social Connection and Isolation Panel    Frequency of Communication with Friends and Family: More than three times a week    Frequency of Social Gatherings with Friends and Family: Twice a week    Attends Religious Services: More than 4 times per year    Active Member of [employer] or Organizations: Yes    Attends Engineer, structural: More than 4 times per year    Marital Status: Married    Tobacco Counseling Counseling given: Not Answered   Clinical Intake:  Pre-visit preparation completed: Yes  Pain : No/denies pain     BMI - recorded: 30.26 Nutritional Status: BMI > 30  Obese Nutritional Risks: None Diabetes: No  How often do you need to have someone help you when you read instructions, pamphlets, or other written materials from your doctor or pharmacy?: 1 - Never What is the last grade level you completed in school?: 14  Interpreter Needed?: No      Activities of Daily Living    03/24/2024    8:00 AM 03/23/2024   10:58 PM  In your present state of health, do you have any difficulty performing the following activities:  Hearing? 0 0  Vision? 0 0  Difficulty concentrating or making decisions? 0 0   Walking or climbing stairs? 0 0  Dressing or bathing? 0 0  Doing errands, shopping? 0 0  Preparing Food and eating ? N N  Using the Toilet? N N  In the past six months, have you accidently leaked urine? N N  Do you have problems with loss of bowel control? N N  Managing your Medications? N N  Managing your Finances? N N  Housekeeping or managing your Housekeeping? N N    Patient Care Team: Alvia Bring, DO as PCP - General (Family Medicine) Curtis Debby PARAS, MD as Consulting Physician (Family Medicine)  Indicate any recent Medical Services you may have received from other than [facility] providers in the past year (date may be approximate).  Assessment:   This is a routine wellness examination for Matthew Hull.  Hearing/Vision screen No results found.   Goals Addressed             This Visit's Progress    Patient Stated       Patient states he would like to increase water intake.        Depression Screen    03/24/2024    8:05 AM 03/19/2023    8:07 AM 12/05/2022    2:45 PM 02/13/2022    1:10 PM 12/04/2021    2:28 PM 03/02/2019   10:13 AM 12/09/2018   10:34 AM  PHQ 2/9 Scores  PHQ - 2 Score 0 0 0 0 0 0 0  PHQ- 9 Score       2    Fall Risk    03/24/2024    8:07 AM 03/23/2024   10:58 PM 03/19/2023    8:07 AM 03/15/2023    1:03 PM 12/05/2022    2:45 PM  Fall Risk   Falls in the past year? 0 0 0 0 0  Number falls in past yr: 0  0  0  Injury with Fall? 0  0  0  Risk for fall due to : No Fall Risks  No Fall Risks  No Fall Risks  Follow up   Falls evaluation completed  Falls evaluation completed    MEDICARE RISK AT HOME: Medicare Risk at Home Any stairs in or around the home?: Yes If so, are there any without handrails?: No Home free of loose throw rugs in walkways, pet beds, electrical cords, etc?: Yes Adequate lighting in your home to reduce risk of falls?: Yes Life alert?: No Use of a cane, walker or w/c?: No Grab bars in the bathroom?: Yes Shower chair or bench  in shower?: No Elevated toilet seat or a handicapped toilet?: Yes  TIMED UP AND GO:  Was the test performed?  No    Cognitive Function:        03/24/2024    8:08 AM 03/19/2023    8:11 AM 02/13/2022    1:15 PM 03/02/2019   10:18 AM  6CIT Screen  What Year? 0 points 0 points 0 points 0 points  What month? 0 points 0 points 0 points 0 points  What time? 0 points 0 points 0 points 0 points  Count back from 20 0 points 0 points 0 points 0 points  Months in reverse 0 points 0 points 2 points 0 points  Repeat phrase 0 points 0 points 0 points 0 points  Total Score 0 points 0 points 2 points 0 points    Immunizations Immunization History  Administered Date(s) Administered   Fluad Quad(high Dose 65+) 06/11/2019, 06/14/2020   Influenza, High Dose Seasonal PF 08/28/2017, 09/01/2018   Influenza-Unspecified 07/01/2014   Pneumococcal Conjugate-13 03/20/2016   Pneumococcal Polysaccharide-23 02/26/2018   Zoster Recombinant(Shingrix) 04/22/2020, 05/02/2021    TDAP status: Due, Education has been provided regarding the importance of this vaccine. Advised may receive this vaccine at local pharmacy or Health Dept. Aware to provide a copy of the vaccination record if obtained from local pharmacy or Health Dept. Verbalized acceptance and understanding.  Flu Vaccine status: Declined, Education has been provided regarding the importance of this vaccine but patient still declined. Advised may receive this vaccine at local pharmacy or Health Dept. Aware to provide a copy of the vaccination record if obtained from local pharmacy or Health Dept. Verbalized acceptance and understanding.  Pneumococcal  vaccine status: Due, Education has been provided regarding the importance of this vaccine. Advised may receive this vaccine at local pharmacy or Health Dept. Aware to provide a copy of the vaccination record if obtained from local pharmacy or Health Dept. Verbalized acceptance and understanding.  Covid-19  vaccine status: Declined, Education has been provided regarding the importance of this vaccine but patient still declined. Advised may receive this vaccine at local pharmacy or Health Dept.or vaccine clinic. Aware to provide a copy of the vaccination record if obtained from local pharmacy or Health Dept. Verbalized acceptance and understanding.  Qualifies for Shingles Vaccine? Yes   Zostavax completed No   Shingrix Completed?: Yes  Screening Tests Health Maintenance  Topic Date Due   Hepatitis C Screening  Never done   DTaP/Tdap/Td (1 - Tdap) Never done   COVID-19 Vaccine (1 - 2024-25 season) Never done   INFLUENZA VACCINE  05/01/2024   Medicare Annual Wellness (AWV)  03/24/2025   Pneumococcal Vaccine: 50+ Years  Completed   Zoster Vaccines- Shingrix  Completed   Hepatitis B Vaccines  Aged Out   HPV VACCINES  Aged Out   Meningococcal B Vaccine  Aged Out   Colonoscopy  Discontinued    Health Maintenance  Health Maintenance Due  Topic Date Due   Hepatitis C Screening  Never done   DTaP/Tdap/Td (1 - Tdap) Never done   COVID-19 Vaccine (1 - 2024-25 season) Never done    Colorectal cancer screening: Type of screening: Colonoscopy. Completed 03/12/2018. Repeat every 10 years  Lung Cancer Screening: (Low Dose CT Chest recommended if Age 70-80 years, 20 pack-year currently smoking OR have quit w/in 15years.) does not qualify.   Lung Cancer Screening Referral: n/a  Additional Screening:  Hepatitis C Screening: does not qualify; Completed   Vision Screening: Recommended annual ophthalmology exams for early detection of glaucoma and other disorders of the eye. Is the patient up to date with their annual eye exam?  Yes  Who is the provider or what is the name of the office in which the patient attends annual eye exams? Dr Nicholaus If pt is not established with a provider, would they like to be referred to a provider to establish care? No .   Dental Screening: Recommended annual dental  exams for proper oral hygiene   Community Resource Referral / Chronic Care Management: CRR required this visit?  No   CCM required this visit?  No     Plan:     I have personally reviewed and noted the following in the patient's chart:   Medical and social history Use of alcohol, tobacco or illicit drugs  Current medications and supplements including opioid prescriptions. Patient is not currently taking opioid prescriptions. Functional ability and status Nutritional status Physical activity Advanced directives List of other physicians Hospitalizations, surgeries, and ER visits in previous 12 months. None Vitals Screenings to include cognitive, depression, and falls Referrals and appointments  In addition, I have reviewed and discussed with patient certain preventive protocols, quality metrics, and best practice recommendations. A written personalized care plan for preventive services as well as general preventive health recommendations were provided to patient.     Bonny Jon Mayor, CMA   03/24/2024   After Visit Summary: (MyChart) Due to this being a telephonic visit, the after visit summary with patients personalized plan was offered to patient via MyChart   Nurse Notes:   Matthew Hull is a 78 y.o. male patient of Alvia Bring, DO who had a  Medicare Annual Wellness Visit today via telephone. Matthew Hull is Retired and lives with their spouse. He has 5 children. he reports that he is socially active and does interact with friends/family regularly. 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

## 2024-03-24 NOTE — Patient Instructions (Signed)
  Mr. Matthew Hull , Thank you for taking time to come for your Medicare Wellness Visit. I appreciate your ongoing commitment to your health goals. Please review the following plan we discussed and let me know if I can assist you in the future.   These are the goals we discussed:  Goals       Patient Stated      Patient states would like to get in better shape and get his knees pain free      Patient Stated (pt-stated)      Patient would like to continue to stay healthy and not get any worse.      Patient Stated (pt-stated)      Patient stated that he would like to continue to be able to be active.       Patient Stated      Patient states he would like to increase water intake.         This is a list of the screening recommended for you and due dates:  Health Maintenance  Topic Date Due   Hepatitis C Screening  Never done   DTaP/Tdap/Td vaccine (1 - Tdap) Never done   COVID-19 Vaccine (1 - 2024-25 season) Never done   Flu Shot  05/01/2024   Medicare Annual Wellness Visit  03/24/2025   Pneumococcal Vaccine for age over 25  Completed   Zoster (Shingles) Vaccine  Completed   Hepatitis B Vaccine  Aged Out   HPV Vaccine  Aged Out   Meningitis B Vaccine  Aged Out   Colon Cancer Screening  Discontinued

## 2024-04-21 ENCOUNTER — Ambulatory Visit: Payer: Medicare Other | Admitting: Family Medicine

## 2024-04-21 ENCOUNTER — Encounter: Payer: Self-pay | Admitting: Family Medicine

## 2024-04-21 ENCOUNTER — Ambulatory Visit (INDEPENDENT_AMBULATORY_CARE_PROVIDER_SITE_OTHER): Admitting: Family Medicine

## 2024-04-21 VITALS — BP 164/89 | HR 74 | Ht 71.5 in | Wt 232.0 lb

## 2024-04-21 DIAGNOSIS — E785 Hyperlipidemia, unspecified: Secondary | ICD-10-CM | POA: Diagnosis not present

## 2024-04-21 DIAGNOSIS — I10 Essential (primary) hypertension: Secondary | ICD-10-CM

## 2024-04-21 DIAGNOSIS — R21 Rash and other nonspecific skin eruption: Secondary | ICD-10-CM | POA: Diagnosis not present

## 2024-04-21 MED ORDER — TRIAMCINOLONE ACETONIDE 0.1 % EX CREA: 1.0000 | TOPICAL_CREAM | Freq: Two times a day (BID) | CUTANEOUS | 0 refills | Status: AC

## 2024-04-21 NOTE — Assessment & Plan Note (Signed)
 BP elevated today.  Repeat at nurse visit in 2 weeks

## 2024-04-21 NOTE — Assessment & Plan Note (Signed)
 Appearance of contact dermatitis.  Will add topical triamcinolone .

## 2024-04-21 NOTE — Assessment & Plan Note (Signed)
 The 10-year ASCVD risk score (Arnett DK, et al., 2019) is: 46.5%   Values used to calculate the score:     Age: 78 years     Clincally relevant sex: Male     Is Non-Hispanic African American: No     Diabetic: No     Tobacco smoker: No     Systolic Blood Pressure: 164 mmHg     Is BP treated: Yes     HDL Cholesterol: 48 mg/dL     Total Cholesterol: 226 mg/dL Declines statin. Discussed risks of not treating elevated cholesterol.

## 2024-04-21 NOTE — Progress Notes (Signed)
 Matthew Hull - 78 y.o. male MRN [id number]  Date of birth: 1946-09-20  Subjective Chief Complaint  Patient presents with   Hypertension    HPI Matthew Hull is a 78 y.o. male here today for follow up visit.   Continues on atenolol -chlorthalidone .  Doing well with this at current strength.  BP elevated on initial check.  Denies chest pain, shortness of breath, palpitations, headache or vision changes.   He has rash on bilateral arms.  May have gotten in to poison ivy/oak while working outdoors.  Using OTC salve.  He does have itching associated with this. Denies pain, drainage or ulceration.   ROS:  A comprehensive ROS was completed and negative except as noted per HPI   No Known Allergies  Past Medical History:  Diagnosis Date   Cataract    bilateral   Mixed hyperlipidemia    Unspecified essential hypertension    unspecified    Past Surgical History:  Procedure Laterality Date   Curettage osteomyelitis focus; right middle finger     EYE SURGERY     I&D of skin and subcutaneous tissue, bone and nonviable, necrotic tissure, right middle finger     JOINT REPLACEMENT      Social History   Socioeconomic History   Marital status: Married    Spouse name: [name]   Number of children: 5   Years of education: 14   Highest education level: Associate degree: occupational, Scientist, product/process development, or vocational program  Occupational History   Occupation: Solicitor    Comment: retired  Tobacco Use   Smoking status: Never   Smokeless tobacco: Never  Vaping Use   Vaping status: Never Used  Substance and Sexual Activity   Alcohol use: Yes    Alcohol/week: 1.0 standard drink of alcohol    Types: 1 Glasses of wine per week    Comment: occasionally   Drug use: No   Sexual activity: Not Currently    Birth control/protection: Abstinence  Other Topics Concern   Not on file  Social History Narrative   Lives with his wife. He enjoys being Systems developer.   Social Drivers of Manufacturing engineer Strain: Low Risk  (04/18/2024)   Overall Financial Resource Strain (CARDIA)    Difficulty of Paying Living Expenses: Not hard at all  Food Insecurity: No Food Insecurity (04/18/2024)   Hunger Vital Sign    Worried About Running Out of Food in the Last Year: Never true    Ran Out of Food in the Last Year: Never true  Transportation Needs: No Transportation Needs (04/18/2024)   PRAPARE - Administrator, Civil Service (Medical): No    Lack of Transportation (Non-Medical): No  Physical Activity: Insufficiently Active (04/18/2024)   Exercise Vital Sign    Days of Exercise per Week: 6 days    Minutes of Exercise per Session: 20 min  Stress: No Stress Concern Present (04/18/2024)   [employer] of Occupational Health - Occupational Stress Questionnaire    Feeling of Stress: Only a little  Social Connections: Socially Integrated (04/18/2024)   Social Connection and Isolation Panel    Frequency of Communication with Friends and Family: Three times a week    Frequency of Social Gatherings with Friends and Family: Once a week    Attends Religious Services: More than 4 times per year    Active Member of [employer] or Organizations: Yes    Attends Banker Meetings: Not on file  Marital Status: Married    Family History  Problem Relation Age of Onset   Throat cancer Father        throat/neck   Other Mother        pacemaker    Health Maintenance  Topic Date Due   Hepatitis C Screening  Never done   DTaP/Tdap/Td (1 - Tdap) 04/21/2025 (Originally 05/26/1965)   COVID-19 Vaccine (1 - 2024-25 season) 05/07/2025 (Originally 06/02/2023)   INFLUENZA VACCINE  05/01/2024   Medicare Annual Wellness (AWV)  03/24/2025   Pneumococcal Vaccine: 50+ Years  Completed   Zoster Vaccines- Shingrix  Completed   Hepatitis B Vaccines  Aged Out   HPV VACCINES  Aged Out   Meningococcal B Vaccine  Aged Out   Colonoscopy  Discontinued      ----------------------------------------------------------------------------------------------------------------------------------------------------------------------------------------------------------------- Physical Exam BP (!) 164/89   Pulse 74   Ht 5' 11.5 (1.816 m)   Wt 232 lb (105.2 kg)   SpO2 96%   BMI 31.91 kg/m   Physical Exam Constitutional:      Appearance: Normal appearance.  Cardiovascular:     Rate and Rhythm: Normal rate and regular rhythm.  Pulmonary:     Effort: Pulmonary effort is normal.     Breath sounds: Normal breath sounds.  Musculoskeletal:     Comments: Slightly raised erythematous patches on b/l forearms.   Neurological:     Mental Status: He is alert.  Psychiatric:        Mood and Affect: Mood normal.        Behavior: Behavior normal.     ------------------------------------------------------------------------------------------------------------------------------------------------------------------------------------------------------------------- Assessment and Plan  Essential hypertension BP elevated today.  Repeat at nurse visit in 2 weeks   Hyperlipidemia The 10-year ASCVD risk score (Arnett DK, et al., 2019) is: 46.5%   Values used to calculate the score:     Age: 55 years     Clincally relevant sex: Male     Is Non-Hispanic African American: No     Diabetic: No     Tobacco smoker: No     Systolic Blood Pressure: 164 mmHg     Is BP treated: Yes     HDL Cholesterol: 48 mg/dL     Total Cholesterol: 226 mg/dL Declines statin. Discussed risks of not treating elevated cholesterol.   Rash Appearance of contact dermatitis.  Will add topical triamcinolone .    Meds ordered this encounter  Medications   triamcinolone  cream (KENALOG ) 0.1 %    Sig: Apply 1 Application topically 2 (two) times daily.    Dispense:  60 g    Refill:  0    Return in about 2 weeks (around 05/05/2024) for nurse visit for BP check.

## 2024-04-22 LAB — BASIC METABOLIC PANEL WITH GFR
BUN/Creatinine Ratio: 26 — ABNORMAL HIGH (ref 10–24)
BUN: 28 mg/dL — ABNORMAL HIGH (ref 8–27)
CO2: 22 mmol/L (ref 20–29)
Calcium: 10.2 mg/dL (ref 8.6–10.2)
Chloride: 98 mmol/L (ref 96–106)
Creatinine, Ser: 1.09 mg/dL (ref 0.76–1.27)
Glucose: 167 mg/dL — ABNORMAL HIGH (ref 70–99)
Potassium: 3.3 mmol/L — ABNORMAL LOW (ref 3.5–5.2)
Sodium: 139 mmol/L (ref 134–144)
eGFR: 70 mL/min/1.73 (ref >59)

## 2024-04-24 ENCOUNTER — Ambulatory Visit: Payer: Self-pay | Admitting: Family Medicine

## 2024-05-05 ENCOUNTER — Ambulatory Visit (INDEPENDENT_AMBULATORY_CARE_PROVIDER_SITE_OTHER): Admitting: Medical-Surgical

## 2024-05-05 VITALS — BP 124/70 | HR 66 | Ht 71.5 in | Wt 234.0 lb

## 2024-05-05 DIAGNOSIS — I10 Essential (primary) hypertension: Secondary | ICD-10-CM | POA: Diagnosis not present

## 2024-05-05 NOTE — Progress Notes (Signed)
 Patient is here for blood pressure check.  Denies chest pain, dizziness, shortness of breath, severe headache, or nosebleeds.  Taking medication as prescribed. Denies missed doses.   Previous BP was 164/89  1st BP today: 164/80  2nd BP today (after 10  minutes): 124/70  Pt to continue with current medication plan. Return in 6 months for BP visit.

## 2024-06-02 ENCOUNTER — Encounter: Payer: Self-pay | Admitting: Sports Medicine

## 2024-10-11 ENCOUNTER — Other Ambulatory Visit: Payer: Self-pay | Admitting: Family Medicine

## 2024-10-11 DIAGNOSIS — I10 Essential (primary) hypertension: Secondary | ICD-10-CM

## 2024-11-05 ENCOUNTER — Ambulatory Visit (INDEPENDENT_AMBULATORY_CARE_PROVIDER_SITE_OTHER): Admitting: Family Medicine

## 2024-11-05 ENCOUNTER — Encounter: Payer: Self-pay | Admitting: Family Medicine

## 2024-11-05 VITALS — BP 145/77 | HR 61 | Ht 71.5 in | Wt 230.0 lb

## 2024-11-05 DIAGNOSIS — E785 Hyperlipidemia, unspecified: Secondary | ICD-10-CM | POA: Diagnosis not present

## 2024-11-05 DIAGNOSIS — I10 Essential (primary) hypertension: Secondary | ICD-10-CM

## 2024-11-05 DIAGNOSIS — R7303 Prediabetes: Secondary | ICD-10-CM | POA: Diagnosis not present

## 2024-11-05 NOTE — Progress Notes (Signed)
 " Matthew Hull - 79 y.o. male MRN [id number]  Date of birth: 05-Feb-1946  Subjective Chief Complaint  Patient presents with   Hypertension    HPI Matthew Hull is a 79 y.o. male here today for follow up visit.   Reports that he is doing well.  Continues on tenoretic .  He denies side effects from medication.  He has not had chest pain, shortness of breath, palpitations, headache or vision changes.  He is quite active.  Bp at home have been well controlled.   ROS:  A comprehensive ROS was completed and negative except as noted per HPI  Allergies[1]  Past Medical History:  Diagnosis Date   Cataract    bilateral   Mixed hyperlipidemia    Unspecified essential hypertension    unspecified    Past Surgical History:  Procedure Laterality Date   Curettage osteomyelitis focus; right middle finger     EYE SURGERY     I&D of skin and subcutaneous tissue, bone and nonviable, necrotic tissure, right middle finger     JOINT REPLACEMENT      Social History   Socioeconomic History   Marital status: Married    Spouse name: [name]   Number of children: 5   Years of education: 14   Highest education level: Associate degree: occupational, scientist, product/process development, or vocational program  Occupational History   Occupation: Solicitor    Comment: retired  Tobacco Use   Smoking status: Never   Smokeless tobacco: Never  Vaping Use   Vaping status: Never Used  Substance and Sexual Activity   Alcohol use: Yes    Alcohol/week: 1.0 standard drink of alcohol    Types: 1 Glasses of wine per week    Comment: occasionally   Drug use: No   Sexual activity: Not Currently    Birth control/protection: Abstinence  Other Topics Concern   Not on file  Social History Narrative   Lives with his wife. He enjoys being systems developer.   Social Drivers of Health   Tobacco Use: Low Risk (11/05/2024)   Patient History    Smoking Tobacco Use: Never    Smokeless Tobacco Use: Never    Passive Exposure: Not on  file  Financial Resource Strain: Low Risk (04/18/2024)   Overall Financial Resource Strain (CARDIA)    Difficulty of Paying Living Expenses: Not hard at all  Food Insecurity: No Food Insecurity (04/18/2024)   Epic    Worried About Programme Researcher, Broadcasting/film/video in the Last Year: Never true    Ran Out of Food in the Last Year: Never true  Transportation Needs: No Transportation Needs (04/18/2024)   Epic    Lack of Transportation (Medical): No    Lack of Transportation (Non-Medical): No  Physical Activity: Insufficiently Active (04/18/2024)   Exercise Vital Sign    Days of Exercise per Week: 6 days    Minutes of Exercise per Session: 20 min  Stress: No Stress Concern Present (04/18/2024)   [employer] of Occupational Health - Occupational Stress Questionnaire    Feeling of Stress: Only a little  Social Connections: Socially Integrated (04/18/2024)   Social Connection and Isolation Panel    Frequency of Communication with Friends and Family: Three times a week    Frequency of Social Gatherings with Friends and Family: Once a week    Attends Religious Services: More than 4 times per year    Active Member of [employer] or Organizations: Yes    Attends Banker Meetings:  Not on file    Marital Status: Married  Depression (PHQ2-9): Low Risk (03/24/2024)   Depression (PHQ2-9)    PHQ-2 Score: 0  Alcohol Screen: Low Risk (04/18/2024)   Alcohol Screen    Last Alcohol Screening Score (AUDIT): 1  Housing: Low Risk (04/18/2024)   Epic    Unable to Pay for Housing in the Last Year: No    Number of Times Moved in the Last Year: 0    Homeless in the Last Year: No  Utilities: Not At Risk (03/24/2024)   Epic    Threatened with loss of utilities: No  Health Literacy: Adequate Health Literacy (03/24/2024)   B1300 Health Literacy    Frequency of need for help with medical instructions: Never    Family History  Problem Relation Age of Onset   Throat cancer Father        throat/neck   Other Mother         pacemaker    Health Maintenance  Topic Date Due   Hepatitis C Screening  Never done   Influenza Vaccine  12/29/2024 (Originally 05/01/2024)   DTaP/Tdap/Td (1 - Tdap) 04/21/2025 (Originally 05/26/1965)   COVID-19 Vaccine (1 - 2025-26 season) 05/31/2025 (Originally 06/01/2024)   Medicare Annual Wellness (AWV)  03/24/2025   Pneumococcal Vaccine: 50+ Years  Completed   Zoster Vaccines- Shingrix  Completed   Meningococcal B Vaccine  Aged Out   Colonoscopy  Discontinued     ----------------------------------------------------------------------------------------------------------------------------------------------------------------------------------------------------------------- Physical Exam BP (!) 154/75 (BP Location: Left Arm, Patient Position: Sitting, Cuff Size: Large)   Pulse 61   Ht 5' 11.5 (1.816 m)   Wt 230 lb (104.3 kg)   SpO2 99%   BMI 31.63 kg/m   Physical Exam Constitutional:      Appearance: Normal appearance.  Eyes:     General: No scleral icterus. Cardiovascular:     Rate and Rhythm: Normal rate and regular rhythm.  Pulmonary:     Effort: Pulmonary effort is normal.     Breath sounds: Normal breath sounds.  Neurological:     Mental Status: He is alert.  Psychiatric:        Mood and Affect: Mood normal.        Behavior: Behavior normal.     ------------------------------------------------------------------------------------------------------------------------------------------------------------------------------------------------------------------- Assessment and Plan  Essential hypertension BP is mildly elevated, better on recheck and numbers at home are well controlled.  Continue on tenoretic  at current strength.   Hyperlipidemia The 10-year ASCVD risk score (Arnett DK, et al., 2019) is: 44.8%   Values used to calculate the score:     Age: 69 years     Clinically relevant sex: Male     Is Non-Hispanic African American: No     Diabetic: No      Tobacco smoker: No     Systolic Blood Pressure: 154 mmHg     Is BP treated: Yes     HDL Cholesterol: 48 mg/dL     Total Cholesterol: 226 mg/dL Declines statin. Discussed risks of not treating elevated cholesterol. Update lipid panel  Prediabetes Update A1c today.    No orders of the defined types were placed in this encounter.   Return in about 6 months (around 05/05/2025) for Hypertension.        [1] No Known Allergies  "

## 2024-11-05 NOTE — Assessment & Plan Note (Addendum)
 BP is mildly elevated, better on recheck and numbers at home are well controlled.  Continue on tenoretic  at current strength.

## 2024-11-05 NOTE — Assessment & Plan Note (Signed)
"  Update A1c today    "

## 2024-11-05 NOTE — Assessment & Plan Note (Signed)
 The 10-year ASCVD risk score (Arnett DK, et al., 2019) is: 44.8%   Values used to calculate the score:     Age: 79 years     Clinically relevant sex: Male     Is Non-Hispanic African American: No     Diabetic: No     Tobacco smoker: No     Systolic Blood Pressure: 154 mmHg     Is BP treated: Yes     HDL Cholesterol: 48 mg/dL     Total Cholesterol: 226 mg/dL Declines statin. Discussed risks of not treating elevated cholesterol. Update lipid panel

## 2024-11-06 LAB — CBC WITH DIFFERENTIAL/PLATELET
Basophils Absolute: 0 10*3/uL (ref 0.0–0.2)
Basos: 1 %
EOS (ABSOLUTE): 0.2 10*3/uL (ref 0.0–0.4)
Eos: 2 %
Hematocrit: 50.2 % (ref 37.5–51.0)
Hemoglobin: 17.4 g/dL (ref 13.0–17.7)
Immature Grans (Abs): 0 10*3/uL (ref 0.0–0.1)
Immature Granulocytes: 0 %
Lymphocytes Absolute: 2.3 10*3/uL (ref 0.7–3.1)
Lymphs: 31 %
MCH: 32.3 pg (ref 26.6–33.0)
MCHC: 34.7 g/dL (ref 31.5–35.7)
MCV: 93 fL (ref 79–97)
Monocytes Absolute: 0.7 10*3/uL (ref 0.1–0.9)
Monocytes: 10 %
Neutrophils Absolute: 4.2 10*3/uL (ref 1.4–7.0)
Neutrophils: 56 %
Platelets: 224 10*3/uL (ref 150–450)
RBC: 5.39 x10E6/uL (ref 4.14–5.80)
RDW: 12.3 % (ref 11.6–15.4)
WBC: 7.4 10*3/uL (ref 3.4–10.8)

## 2024-11-06 LAB — CMP14+EGFR
ALT: 55 [IU]/L — ABNORMAL HIGH (ref 0–44)
AST: 36 [IU]/L (ref 0–40)
Albumin: 4.6 g/dL (ref 3.8–4.8)
Alkaline Phosphatase: 66 [IU]/L (ref 47–123)
BUN/Creatinine Ratio: 29 — ABNORMAL HIGH (ref 10–24)
BUN: 30 mg/dL — ABNORMAL HIGH (ref 8–27)
Bilirubin Total: 0.7 mg/dL (ref 0.0–1.2)
CO2: 26 mmol/L (ref 20–29)
Calcium: 10.3 mg/dL — ABNORMAL HIGH (ref 8.6–10.2)
Chloride: 99 mmol/L (ref 96–106)
Creatinine, Ser: 1.02 mg/dL (ref 0.76–1.27)
Globulin, Total: 2.4 g/dL (ref 1.5–4.5)
Glucose: 103 mg/dL — ABNORMAL HIGH (ref 70–99)
Potassium: 3.6 mmol/L (ref 3.5–5.2)
Sodium: 140 mmol/L (ref 134–144)
Total Protein: 7 g/dL (ref 6.0–8.5)
eGFR: 75 mL/min/{1.73_m2}

## 2024-11-06 LAB — HEMOGLOBIN A1C
Est. average glucose Bld gHb Est-mCnc: 120 mg/dL
Hgb A1c MFr Bld: 5.8 % — ABNORMAL HIGH (ref 4.8–5.6)

## 2024-11-06 LAB — LIPID PANEL WITH LDL/HDL RATIO
Cholesterol, Total: 253 mg/dL — ABNORMAL HIGH (ref 100–199)
HDL: 44 mg/dL
LDL Chol Calc (NIH): 176 mg/dL — ABNORMAL HIGH (ref 0–99)
LDL/HDL Ratio: 4 ratio — ABNORMAL HIGH (ref 0.0–3.6)
Triglycerides: 175 mg/dL — ABNORMAL HIGH (ref 0–149)
VLDL Cholesterol Cal: 33 mg/dL (ref 5–40)

## 2025-03-25 ENCOUNTER — Ambulatory Visit

## 2025-05-05 ENCOUNTER — Ambulatory Visit: Admitting: Family Medicine
